# Patient Record
Sex: Female | Born: 1995
Health system: Southern US, Community
[De-identification: ages and names within clinical notes are randomized; demographics above are authoritative.]

## PROBLEM LIST (undated history)

## (undated) DIAGNOSIS — I10 Essential (primary) hypertension: Secondary | ICD-10-CM

## (undated) DIAGNOSIS — N289 Disorder of kidney and ureter, unspecified: Secondary | ICD-10-CM

## (undated) DIAGNOSIS — IMO0002 Reserved for concepts with insufficient information to code with codable children: Secondary | ICD-10-CM

## (undated) DIAGNOSIS — M329 Systemic lupus erythematosus, unspecified: Secondary | ICD-10-CM

## (undated) HISTORY — PX: RENAL BIOPSY: SHX156

---
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
## 2017-02-11 DIAGNOSIS — Z79899 Other long term (current) drug therapy: Secondary | ICD-10-CM | POA: Diagnosis not present

## 2017-02-11 DIAGNOSIS — R899 Unspecified abnormal finding in specimens from other organs, systems and tissues: Secondary | ICD-10-CM | POA: Diagnosis not present

## 2017-02-11 DIAGNOSIS — M328 Other forms of systemic lupus erythematosus: Secondary | ICD-10-CM | POA: Diagnosis not present

## 2017-02-25 DIAGNOSIS — Z79899 Other long term (current) drug therapy: Secondary | ICD-10-CM | POA: Diagnosis not present

## 2017-02-25 DIAGNOSIS — M3214 Glomerular disease in systemic lupus erythematosus: Secondary | ICD-10-CM | POA: Diagnosis not present

## 2017-02-25 DIAGNOSIS — M328 Other forms of systemic lupus erythematosus: Secondary | ICD-10-CM | POA: Diagnosis not present

## 2017-03-18 DIAGNOSIS — R0602 Shortness of breath: Secondary | ICD-10-CM | POA: Diagnosis not present

## 2017-03-18 DIAGNOSIS — M328 Other forms of systemic lupus erythematosus: Secondary | ICD-10-CM | POA: Diagnosis not present

## 2017-03-23 DIAGNOSIS — H538 Other visual disturbances: Secondary | ICD-10-CM | POA: Diagnosis not present

## 2017-03-23 DIAGNOSIS — L930 Discoid lupus erythematosus: Secondary | ICD-10-CM | POA: Diagnosis not present

## 2017-04-15 DIAGNOSIS — M328 Other forms of systemic lupus erythematosus: Secondary | ICD-10-CM | POA: Diagnosis not present

## 2017-04-15 DIAGNOSIS — R0602 Shortness of breath: Secondary | ICD-10-CM | POA: Diagnosis not present

## 2017-04-29 DIAGNOSIS — M3214 Glomerular disease in systemic lupus erythematosus: Secondary | ICD-10-CM | POA: Diagnosis not present

## 2017-04-29 DIAGNOSIS — Z79899 Other long term (current) drug therapy: Secondary | ICD-10-CM | POA: Diagnosis not present

## 2017-05-13 DIAGNOSIS — M328 Other forms of systemic lupus erythematosus: Secondary | ICD-10-CM | POA: Diagnosis not present

## 2017-05-13 DIAGNOSIS — R0602 Shortness of breath: Secondary | ICD-10-CM | POA: Diagnosis not present

## 2017-05-14 IMAGING — DX DG CHEST 2V
2 series · 2 of 2 positions shown · non-contrast
Comparison: None.

CLINICAL DATA: Generalized chest pain for 3 weeks. Chills for 3
days. History of lupus.

EXAM:
CHEST  2 VIEW

[chest lat]
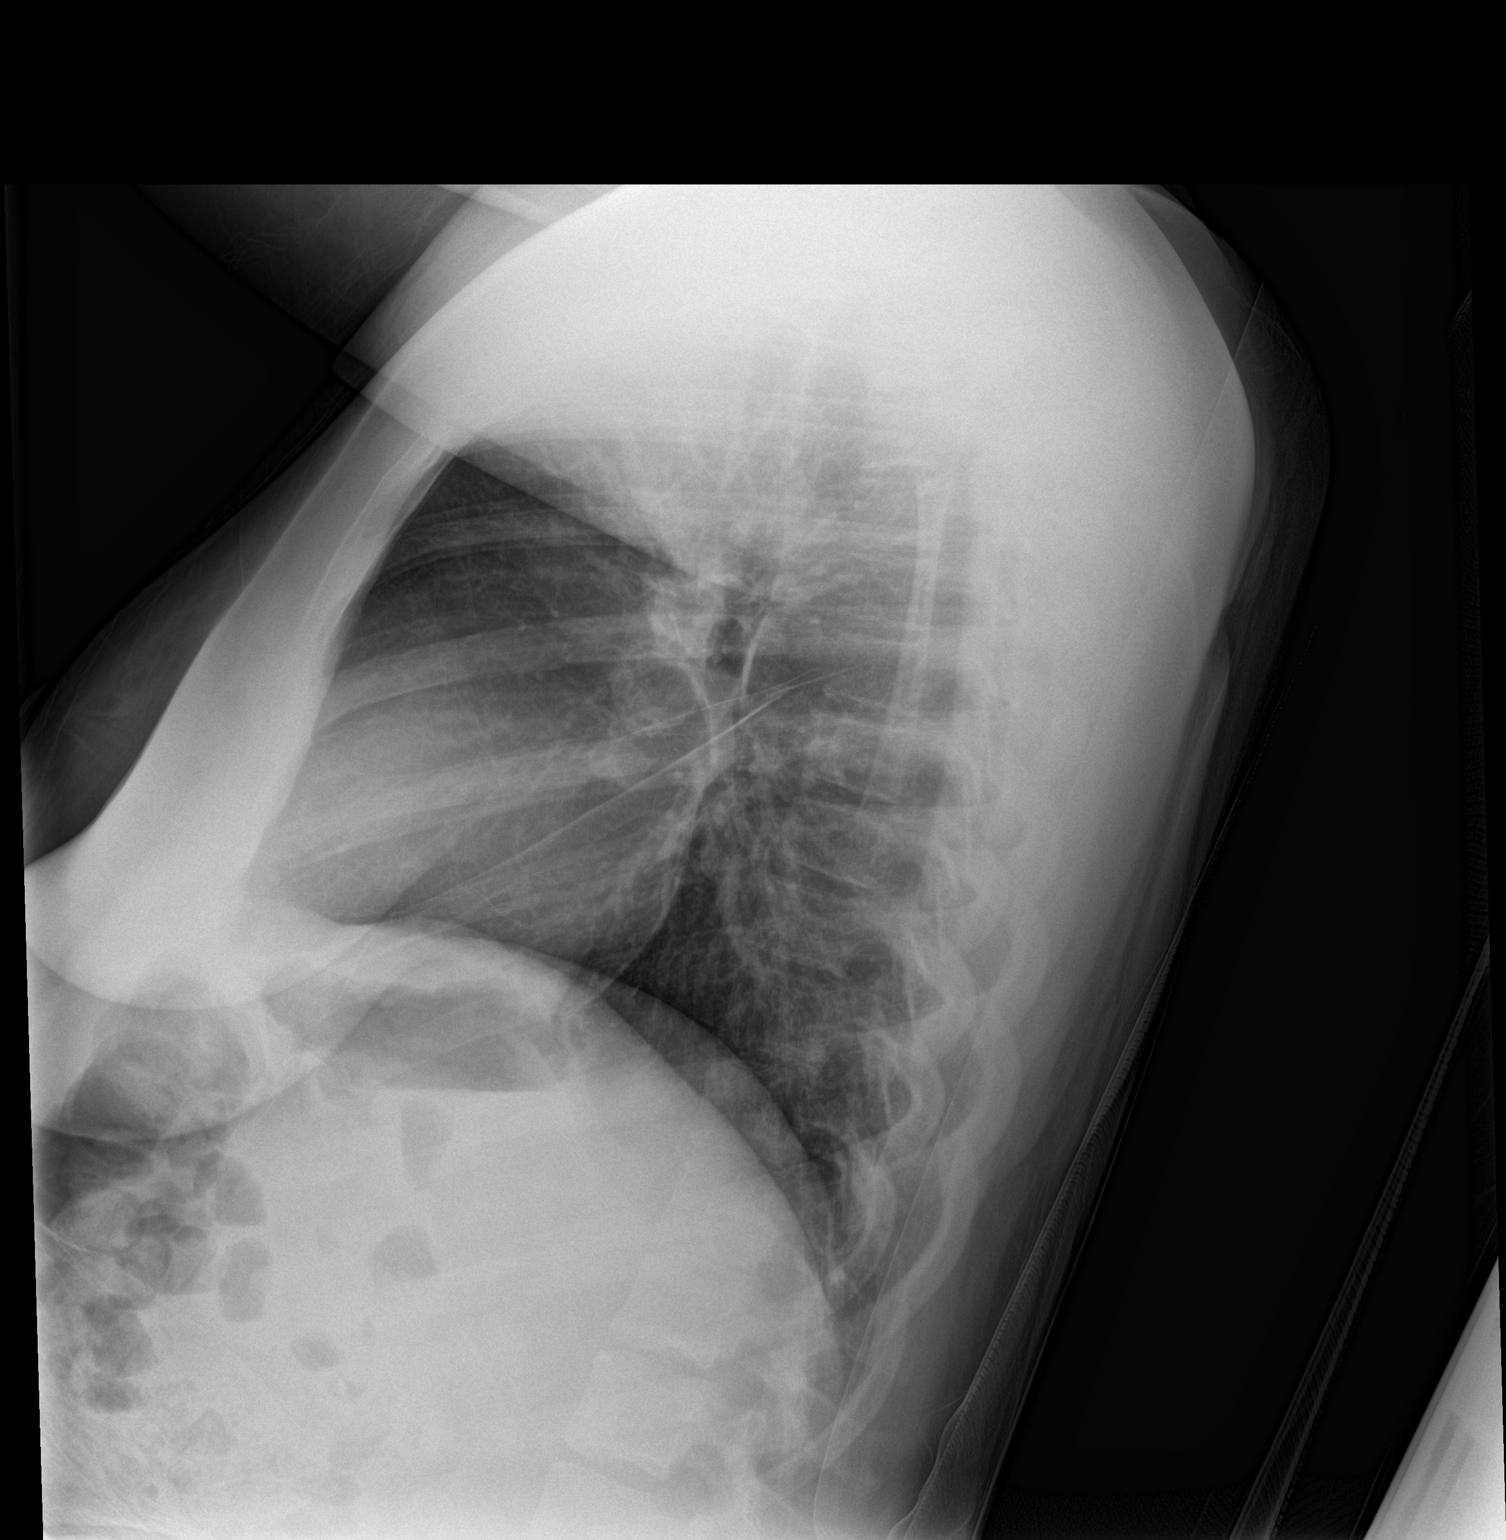

[chest ap]
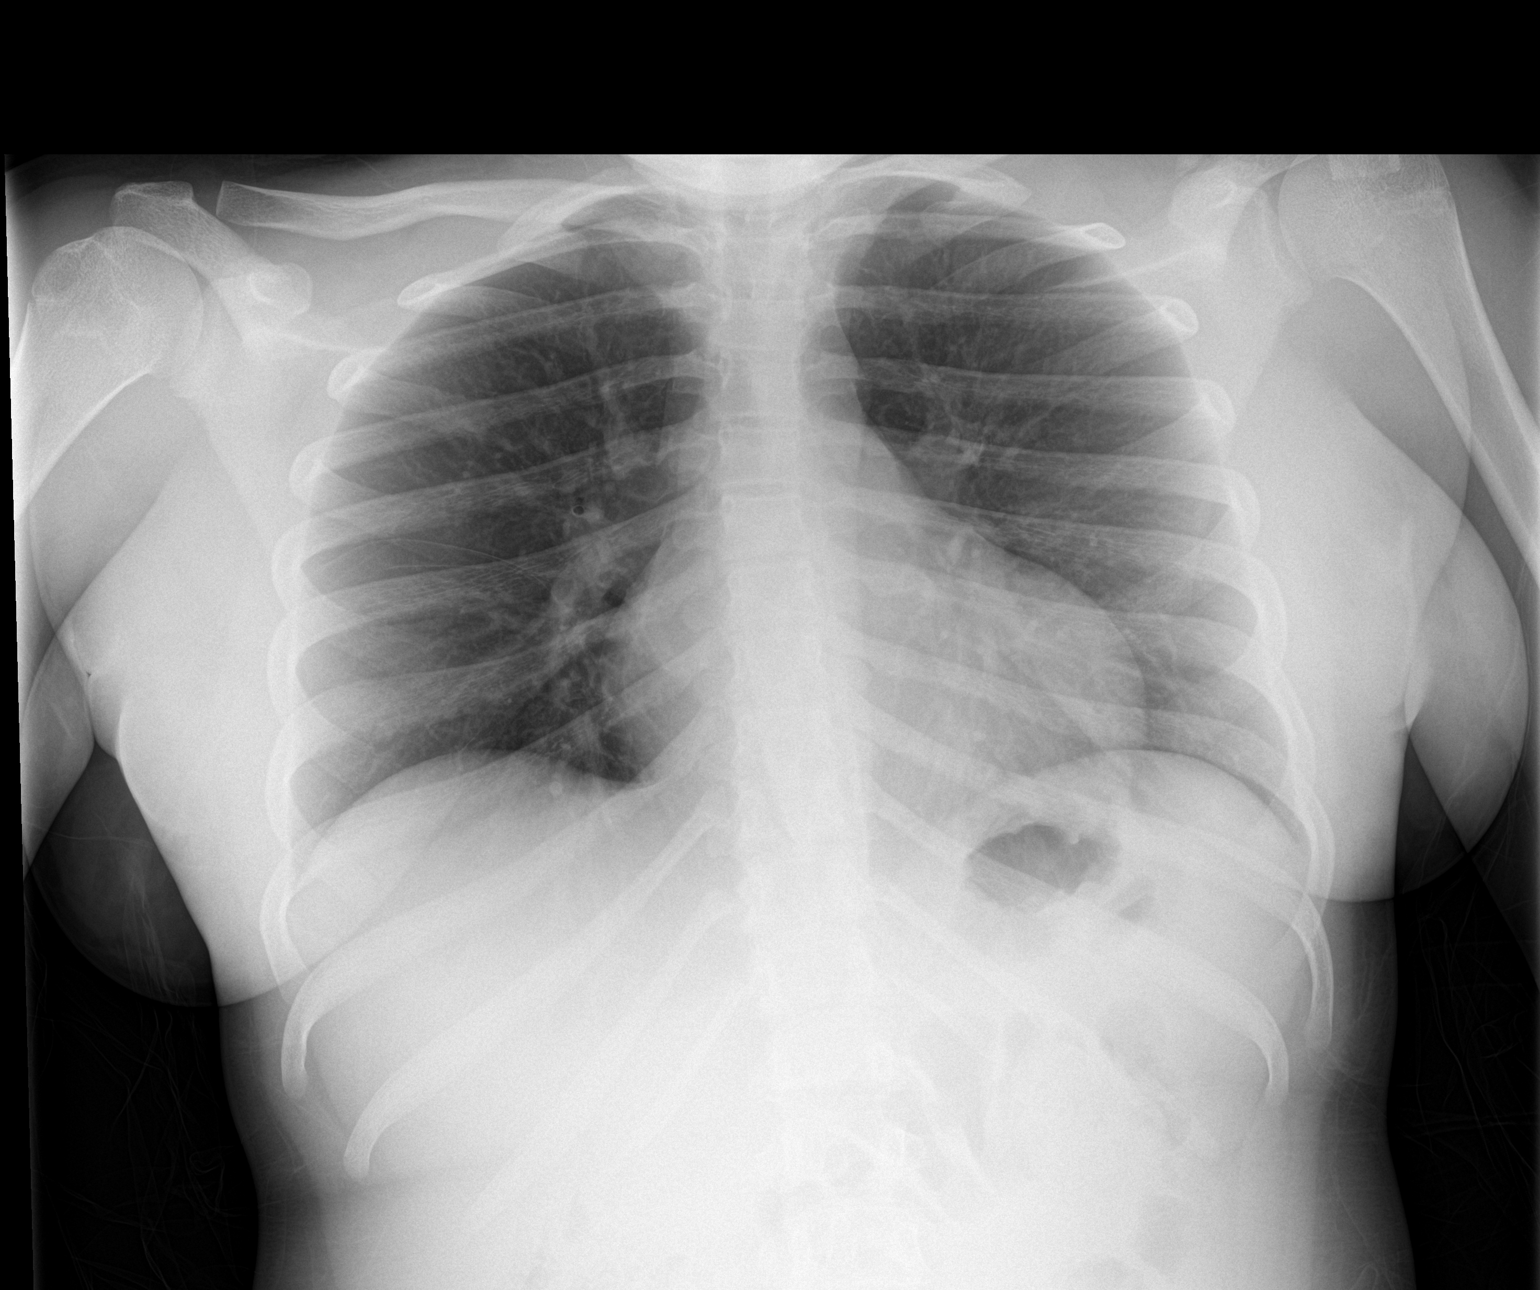

[2 of 2 positions shown; findings below may reference images not displayed]

FINDINGS: Apical lordotic projection. Given this projection and the AP
technique, heart size is within normal limits.

The lungs appear clear.  No pleural effusion.
IMPRESSION: 1.  No significant abnormality identified.

## 2017-06-10 DIAGNOSIS — Z79899 Other long term (current) drug therapy: Secondary | ICD-10-CM | POA: Diagnosis not present

## 2017-06-10 DIAGNOSIS — M3214 Glomerular disease in systemic lupus erythematosus: Secondary | ICD-10-CM | POA: Diagnosis not present

## 2017-06-16 DIAGNOSIS — Z30018 Encounter for initial prescription of other contraceptives: Secondary | ICD-10-CM | POA: Diagnosis not present

## 2017-06-16 DIAGNOSIS — N760 Acute vaginitis: Secondary | ICD-10-CM | POA: Diagnosis not present

## 2017-06-16 DIAGNOSIS — Z113 Encounter for screening for infections with a predominantly sexual mode of transmission: Secondary | ICD-10-CM | POA: Diagnosis not present

## 2017-06-16 DIAGNOSIS — N898 Other specified noninflammatory disorders of vagina: Secondary | ICD-10-CM | POA: Diagnosis not present

## 2017-06-17 DIAGNOSIS — M328 Other forms of systemic lupus erythematosus: Secondary | ICD-10-CM | POA: Diagnosis not present

## 2017-06-17 DIAGNOSIS — R0602 Shortness of breath: Secondary | ICD-10-CM | POA: Diagnosis not present

## 2017-06-17 DIAGNOSIS — Z79899 Other long term (current) drug therapy: Secondary | ICD-10-CM | POA: Diagnosis not present

## 2017-07-01 DIAGNOSIS — Z30013 Encounter for initial prescription of injectable contraceptive: Secondary | ICD-10-CM | POA: Diagnosis not present

## 2017-07-08 DIAGNOSIS — M328 Other forms of systemic lupus erythematosus: Secondary | ICD-10-CM | POA: Diagnosis not present

## 2017-07-08 DIAGNOSIS — R0602 Shortness of breath: Secondary | ICD-10-CM | POA: Diagnosis not present

## 2017-07-16 DIAGNOSIS — Z79899 Other long term (current) drug therapy: Secondary | ICD-10-CM | POA: Diagnosis not present

## 2017-07-16 DIAGNOSIS — M329 Systemic lupus erythematosus, unspecified: Secondary | ICD-10-CM | POA: Diagnosis not present

## 2017-08-05 DIAGNOSIS — R0602 Shortness of breath: Secondary | ICD-10-CM | POA: Diagnosis not present

## 2017-08-05 DIAGNOSIS — M328 Other forms of systemic lupus erythematosus: Secondary | ICD-10-CM | POA: Diagnosis not present

## 2017-09-08 DIAGNOSIS — R0602 Shortness of breath: Secondary | ICD-10-CM | POA: Diagnosis not present

## 2017-09-08 DIAGNOSIS — M328 Other forms of systemic lupus erythematosus: Secondary | ICD-10-CM | POA: Diagnosis not present

## 2017-09-11 DIAGNOSIS — M3214 Glomerular disease in systemic lupus erythematosus: Secondary | ICD-10-CM | POA: Diagnosis not present

## 2017-09-28 DIAGNOSIS — Z3042 Encounter for surveillance of injectable contraceptive: Secondary | ICD-10-CM | POA: Diagnosis not present

## 2017-09-28 DIAGNOSIS — Z3202 Encounter for pregnancy test, result negative: Secondary | ICD-10-CM | POA: Diagnosis not present

## 2017-09-30 DIAGNOSIS — R630 Anorexia: Secondary | ICD-10-CM | POA: Diagnosis not present

## 2017-09-30 DIAGNOSIS — E559 Vitamin D deficiency, unspecified: Secondary | ICD-10-CM | POA: Diagnosis not present

## 2017-09-30 DIAGNOSIS — Z79899 Other long term (current) drug therapy: Secondary | ICD-10-CM | POA: Diagnosis not present

## 2017-09-30 DIAGNOSIS — F419 Anxiety disorder, unspecified: Secondary | ICD-10-CM | POA: Diagnosis not present

## 2017-09-30 DIAGNOSIS — Z9114 Patient's other noncompliance with medication regimen: Secondary | ICD-10-CM | POA: Diagnosis not present

## 2017-09-30 DIAGNOSIS — R634 Abnormal weight loss: Secondary | ICD-10-CM | POA: Diagnosis not present

## 2017-09-30 DIAGNOSIS — R109 Unspecified abdominal pain: Secondary | ICD-10-CM | POA: Diagnosis not present

## 2017-09-30 DIAGNOSIS — M3214 Glomerular disease in systemic lupus erythematosus: Secondary | ICD-10-CM | POA: Diagnosis not present

## 2017-10-06 DIAGNOSIS — R000 Tachycardia, unspecified: Secondary | ICD-10-CM | POA: Diagnosis not present

## 2017-10-06 DIAGNOSIS — E876 Hypokalemia: Secondary | ICD-10-CM | POA: Diagnosis not present

## 2017-10-06 DIAGNOSIS — M328 Other forms of systemic lupus erythematosus: Secondary | ICD-10-CM | POA: Diagnosis not present

## 2017-10-06 DIAGNOSIS — J069 Acute upper respiratory infection, unspecified: Secondary | ICD-10-CM | POA: Diagnosis not present

## 2017-10-07 ENCOUNTER — Encounter (HOSPITAL_COMMUNITY): Payer: Self-pay

## 2017-10-07 ENCOUNTER — Emergency Department (HOSPITAL_COMMUNITY): Payer: BLUE CROSS/BLUE SHIELD

## 2017-10-07 ENCOUNTER — Other Ambulatory Visit: Payer: Self-pay

## 2017-10-07 DIAGNOSIS — Z79899 Other long term (current) drug therapy: Secondary | ICD-10-CM | POA: Diagnosis not present

## 2017-10-07 DIAGNOSIS — I10 Essential (primary) hypertension: Secondary | ICD-10-CM | POA: Insufficient documentation

## 2017-10-07 DIAGNOSIS — R509 Fever, unspecified: Secondary | ICD-10-CM | POA: Diagnosis not present

## 2017-10-07 DIAGNOSIS — R079 Chest pain, unspecified: Secondary | ICD-10-CM | POA: Diagnosis not present

## 2017-10-07 LAB — I-STAT TROPONIN, ED: Troponin i, poc: 0 ng/mL (ref 0.00–0.08)

## 2017-10-07 LAB — I-STAT BETA HCG BLOOD, ED (MC, WL, AP ONLY): I-stat hCG, quantitative: <5 m[IU]/mL (ref <5)

## 2017-10-07 LAB — CBC
HCT: 33.3 % — ABNORMAL LOW (ref 36.0–46.0)
Hemoglobin: 11.2 g/dL — ABNORMAL LOW (ref 12.0–15.0)
MCH: 28.9 pg (ref 26.0–34.0)
MCHC: 33.6 g/dL (ref 30.0–36.0)
MCV: 86 fL (ref 78.0–100.0)
Platelets: 183 10*3/uL (ref 150–400)
RBC: 3.87 MIL/uL (ref 3.87–5.11)
RDW: 13.3 % (ref 11.5–15.5)
WBC: 2.2 10*3/uL — ABNORMAL LOW (ref 4.0–10.5)

## 2017-10-07 LAB — BASIC METABOLIC PANEL
Anion gap: 10 (ref 5–15)
BUN: 11 mg/dL (ref 6–20)
CO2: 24 mmol/L (ref 22–32)
Calcium: 8.6 mg/dL — ABNORMAL LOW (ref 8.9–10.3)
Chloride: 105 mmol/L (ref 101–111)
Creatinine, Ser: 0.72 mg/dL (ref 0.44–1.00)
GFR calc Af Amer: >60 mL/min (ref >60)
GFR calc non Af Amer: >60 mL/min (ref >60)
Glucose, Bld: 74 mg/dL (ref 65–99)
Potassium: 3 mmol/L — ABNORMAL LOW (ref 3.5–5.1)
Sodium: 139 mmol/L (ref 135–145)

## 2017-10-07 NOTE — ED Triage Notes (Signed)
Pt states that she began to have today, central, denies cough. Some SOB, denies n/v

## 2017-10-07 NOTE — ED Triage Notes (Signed)
Pt has Lupus and has had a fever for two days. [facility]. Pt was seen by her doctor Wed and given two IV medications, seen yesterday and could not receive her medication due to fever.

## 2017-10-08 ENCOUNTER — Emergency Department (HOSPITAL_COMMUNITY)
Admission: EM | Admit: 2017-10-08 | Discharge: 2017-10-08 | Disposition: A | Discharge status: Home / Self Care | Payer: BLUE CROSS/BLUE SHIELD | Attending: Emergency Medicine | Admitting: Emergency Medicine

## 2017-10-08 DIAGNOSIS — R509 Fever, unspecified: Secondary | ICD-10-CM

## 2017-10-08 LAB — DIFFERENTIAL
Basophils Absolute: 0 10*3/uL (ref 0.0–0.1)
Basophils Relative: 0 %
Eosinophils Absolute: 0 10*3/uL (ref 0.0–0.7)
Eosinophils Relative: 0 %
Lymphocytes Relative: 25 %
Lymphs Abs: 0.6 10*3/uL — ABNORMAL LOW (ref 0.7–4.0)
Monocytes Absolute: 0.2 10*3/uL (ref 0.1–1.0)
Monocytes Relative: 10 %
Neutro Abs: 1.5 10*3/uL — ABNORMAL LOW (ref 1.7–7.7)
Neutrophils Relative %: 65 %

## 2017-10-08 LAB — URINALYSIS, ROUTINE W REFLEX MICROSCOPIC
Bacteria, UA: NONE SEEN
Bilirubin Urine: NEGATIVE
Glucose, UA: NEGATIVE mg/dL
Hgb urine dipstick: NEGATIVE
Ketones, ur: 5 mg/dL — AB
Leukocytes, UA: NEGATIVE
Nitrite: NEGATIVE
Protein, ur: 30 mg/dL — AB
Specific Gravity, Urine: 1.038 — ABNORMAL HIGH (ref 1.005–1.030)
pH: 5 (ref 5.0–8.0)

## 2017-10-08 LAB — INFLUENZA PANEL BY PCR (TYPE A & B)
Influenza A By PCR: NEGATIVE
Influenza B By PCR: NEGATIVE

## 2017-10-08 MED ORDER — DEXTROSE 5 % IV SOLN
1.0000 g | Freq: Once | INTRAVENOUS | Status: AC
  Administered 2017-10-08: 1 g via INTRAVENOUS
  Filled 2017-10-08: qty 10

## 2017-10-08 NOTE — ED Notes (Signed)
Per MD request, paged the [facility] to Dr. Blinda LeatherwoodPollina @ [phone] to [phone].

## 2017-10-08 NOTE — ED Notes (Signed)
Pt verbalizes understanding of d/c instructions. Pt ambulatory at d/c with all belongings.   

## 2017-10-08 NOTE — ED Notes (Signed)
[facility] #: [phone]  Pt's MD requested certain labs to be taken and to speak with ED MD.

## 2017-10-08 NOTE — ED Provider Notes (Signed)
[facility] EMERGENCY DEPARTMENT Provider Note   CSN: [id number] Arrival date & time: 10/07/17  1956     History   Chief Complaint Chief Complaint  Patient presents with  . Lupus  . Chest Pain    HPI Darlene Ruiz is a 21 y.o. female.  Patient presents to the emergency department for evaluation of chest pain.  Patient has a history of SLE.  She is currently being treated at [facility].  She is on Imuran, prednisone, Prograf and is getting IV infusions of pentamadine every 2 weeks.  She was seen at [facility] on December 4 for her infusion, but a that time she was found to have a fever of 102.1.  Pentamadine was held.  Blood work was performed.  Today she developed central chest pain.  She reports that she has been very tired and sleeping all day.  She did take Tylenol for her fever.  At time of evaluation in the ER exam room, her chest pain has resolved.  She is not experiencing any shortness of breath.  She has not had any cough or chest congestion.      Past Medical History:  Diagnosis Date  . Hypertension   . Lupus   . Renal disorder     There are no active problems to display for this patient.   Past Surgical History:  Procedure Laterality Date  . RENAL BIOPSY      OB History    No data available       Home Medications    Prior to Admission medications   Medication Sig Start Date End Date Taking? Authorizing Provider  benzonatate (TESSALON) 100 MG capsule Take 1 capsule (100 mg total) by mouth 3 (three) times daily as needed for cough. Patient not taking: Reported on 10/07/2017 06/16/15   Doreene Eland, MD  cefUROXime (CEFTIN) 500 MG tablet Take 1 tablet (500 mg total) by mouth 2 (two) times daily with a meal. Patient not taking: Reported on 10/07/2017 06/11/14   Rodolph Bong, MD  Cytomegalovirus Immune Glob (CYTOGAM IV) Inject into the vein every 3 (three) months.    [provider]  folic acid (FOLVITE) 1 MG tablet Take 1 mg by mouth  daily.    [provider]  Guaifenesin 1200 MG TB12 Take 1 tablet (1,200 mg total) by mouth 2 (two) times daily. 10/26/16   Lawyer, Cristal Deer, PA-C  hydroxychloroquine (PLAQUENIL) 200 MG tablet Take 300 mg by mouth daily.    [provider]  ibuprofen (ADVIL,MOTRIN) 800 MG tablet Take 1 tablet (800 mg total) by mouth every 8 (eight) hours as needed. 10/26/16   Lawyer, Cristal Deer, PA-C  lisinopril (PRINIVIL,ZESTRIL) 10 MG tablet Take 10 mg by mouth daily.    [provider]  loratadine (CLARITIN) 10 MG tablet Take 10 mg by mouth daily as needed. allergies    [provider]  montelukast (SINGULAIR) 10 MG tablet Take 10 mg by mouth daily as needed. allergies    [provider]  pimecrolimus (ELIDEL) 1 % cream Apply 1 application topically daily as needed. Skin rash    [provider]  predniSONE (DELTASONE) 20 MG tablet Take 1 tablet (20 mg total) by mouth daily. Patient not taking: Reported on 10/07/2017 05/13/15   Ree Shay, MD  promethazine-dextromethorphan (PROMETHAZINE-DM) 6.25-15 MG/5ML syrup Take 5 mLs by mouth 4 (four) times daily as needed for cough. 10/26/16   Lawyer, Cristal Deer, PA-C  sulfacetamide (BLEPH-10) 10 % ophthalmic ointment 2 drops  in both eyes  Every 3 hrs x 7 days 06/16/15   Janit PaganEniola, Kehinde T, MD  TACROLIMUS PO Take by mouth 2 (two) times daily.    [provider]  traMADol (ULTRAM) 50 MG tablet Take 1 tablet (50 mg total) by mouth every 6 (six) hours as needed. 02/06/16   Charlestine NightLawyer, Christopher, PA-C    Family History Family History  Problem Relation Age of Onset  . Hypertension Other     Social History Social History   Tobacco Use  . Smoking status: Never Smoker  . Smokeless tobacco: Never Used  Substance Use Topics  . Alcohol use: No  . Drug use: No     Allergies   Patient has no known allergies.   Review of Systems Review of Systems  Constitutional: Positive for fatigue and fever.  All other  systems reviewed and are negative.    Physical Exam Updated Vital Signs BP 104/71   Pulse 73   Temp 99.7 F (37.6 C) (Oral)   Resp (!) 23   SpO2 100%   Physical Exam  Constitutional: She is oriented to person, place, and time. She appears well-developed and well-nourished. No distress.  HENT:  Head: Normocephalic and atraumatic.  Right Ear: Hearing normal.  Left Ear: Hearing normal.  Nose: Nose normal.  Mouth/Throat: Oropharynx is clear and moist and mucous membranes are normal.  Eyes: Conjunctivae and EOM are normal. Pupils are equal, round, and reactive to light.  Neck: Normal range of motion. Neck supple.  Cardiovascular: Regular rhythm, S1 normal and S2 normal. Exam reveals no gallop and no friction rub.  No murmur heard. Pulmonary/Chest: Effort normal and breath sounds normal. No respiratory distress. She exhibits no tenderness.  Abdominal: Soft. Normal appearance and bowel sounds are normal. There is no hepatosplenomegaly. There is no tenderness. There is no rebound, no guarding, no tenderness at McBurney's point and negative Murphy's sign. No hernia.  Musculoskeletal: Normal range of motion.  Neurological: She is alert and oriented to person, place, and time. She has normal strength. No cranial nerve deficit or sensory deficit. Coordination normal. GCS eye subscore is 4. GCS verbal subscore is 5. GCS motor subscore is 6.  Skin: Skin is warm, dry and intact. No rash (malar) noted. No cyanosis.  Psychiatric: She has a normal mood and affect. Her speech is normal and behavior is normal. Thought content normal.  Nursing note and vitals reviewed.    ED Treatments / Results  Labs (all labs ordered are listed, but only abnormal results are displayed) Labs Reviewed  BASIC METABOLIC PANEL - Abnormal; Notable for the following components:      Result Value   Potassium 3.0 (*)    Calcium 8.6 (*)    All other components within normal limits  CBC - Abnormal; Notable for the  following components:   WBC 2.2 (*)    Hemoglobin 11.2 (*)    HCT 33.3 (*)    All other components within normal limits  URINALYSIS, ROUTINE W REFLEX MICROSCOPIC - Abnormal; Notable for the following components:   Color, Urine AMBER (*)    APPearance HAZY (*)    Specific Gravity, Urine 1.038 (*)    Ketones, ur 5 (*)    Protein, ur 30 (*)    Squamous Epithelial / LPF 0-5 (*)    All other components within normal limits  DIFFERENTIAL - Abnormal; Notable for the following components:   Neutro Abs 1.5 (*)    Lymphs Abs 0.6 (*)  All other components within normal limits  CULTURE, BLOOD (ROUTINE X 2)  CULTURE, BLOOD (ROUTINE X 2)  URINE CULTURE  INFLUENZA PANEL BY PCR (TYPE A & B)  I-STAT TROPONIN, ED  I-STAT BETA HCG BLOOD, ED ([facility], [facility], [facility] ONLY)    EKG  EKG Interpretation  Date/Time:  Wednesday October 07 2017 20:12:03 EST Ventricular Rate:  87 PR Interval:  162 QRS Duration: 70 QT Interval:  262 QTC Calculation: 315 R Axis:   103 Text Interpretation:  Normal sinus rhythm Rightward axis Septal infarct , age undetermined Abnormal ECG No previous tracing Confirmed by Gilda CreasePollina, Christopher J [phone]([id number]) on 10/08/2017 1:17:45 AM       Radiology Dg Chest 2 View  Result Date: 10/07/2017 CLINICAL DATA:  Central chest pain and fever x2 days EXAM: CHEST  2 VIEW COMPARISON:  10/26/2016 FINDINGS: The heart size and mediastinal contours are within normal limits. Both lungs are clear. The visualized skeletal structures are unremarkable. IMPRESSION: No active cardiopulmonary disease. Electronically Signed   By: Tollie Ethavid  Kwon M.D.   On: 10/07/2017 21:07    Procedures Procedures (including critical care time)  Medications Ordered in ED Medications  cefTRIAXone (ROCEPHIN) 1 g in dextrose 5 % 50 mL IVPB (not administered)     Initial Impression / Assessment and Plan / ED Course  I have reviewed the triage vital signs and the nursing notes.  Pertinent labs & imaging results that were  available during my care of the patient were reviewed by me and considered in my medical decision making (see chart for details).     Patient presents to the emergency department for evaluation of chest pain and fever.  Patient is currently on multiple immunosuppressants secondary to SLE.  [facility] pent[facility]adine 2 days ago, but was febrile and infusion was held.  Patient contacted her rheumatologist today when she started having chest pain and was referred to the ER for recheck.    Patient had resolution of her chest pain before she was seen.  She did not have any associated shortness of breath.  Pain was not pleuritic.  She has not tachycardic or tachypneic.  There is no hypoxia.  At this point there is nothing to indicate pulmonary embolism.  Repeat blood work was performed as requested.  She does not appear to be septic.  White blood cell count is low at 2.2, same as it was 2 days ago in the office.  She does have mild hypokalemia, once again.  Influenza was negative.  Urinalysis was negative.  Chest x-ray was negative.  This was discussed with on[facility]call [facility].  He asked for blood and urine cultures, empiric IV Rocephin and will follow up in the office.  This was discussed with patient and mother, they are comfortable with this plan.  Will contact rheumatology later today.  Final Clinical Impressions(s) / ED Diagnoses   Final diagnoses:  Fever, unspecified fever cause    ED Discharge Orders    None       Pollina, Canary Brimhristopher J, MD 10/08/17 [id number]

## 2017-10-08 NOTE — ED Notes (Signed)
ED Provider at bedside. 

## 2017-10-09 ENCOUNTER — Telehealth: Payer: Self-pay | Admitting: *Deleted

## 2017-10-09 LAB — URINE CULTURE: Culture: NO GROWTH

## 2017-10-09 NOTE — Telephone Encounter (Signed)
Pt mom called for results of urine culture as pt is still running fever.  EDCM read results in chart.

## 2017-10-13 LAB — CULTURE, BLOOD (ROUTINE X 2)
Culture: NO GROWTH
Culture: NO GROWTH
Special Requests: ADEQUATE
Special Requests: ADEQUATE

## 2017-10-14 DIAGNOSIS — M3214 Glomerular disease in systemic lupus erythematosus: Secondary | ICD-10-CM | POA: Diagnosis not present

## 2017-10-20 DIAGNOSIS — Z79899 Other long term (current) drug therapy: Secondary | ICD-10-CM | POA: Diagnosis not present

## 2017-10-20 DIAGNOSIS — R4189 Other symptoms and signs involving cognitive functions and awareness: Secondary | ICD-10-CM | POA: Diagnosis not present

## 2017-10-20 DIAGNOSIS — M3214 Glomerular disease in systemic lupus erythematosus: Secondary | ICD-10-CM | POA: Diagnosis not present

## 2017-10-20 DIAGNOSIS — R5383 Other fatigue: Secondary | ICD-10-CM | POA: Diagnosis not present

## 2017-10-23 DIAGNOSIS — I150 Renovascular hypertension: Secondary | ICD-10-CM | POA: Diagnosis not present

## 2017-10-23 DIAGNOSIS — Z79899 Other long term (current) drug therapy: Secondary | ICD-10-CM | POA: Diagnosis not present

## 2017-10-23 DIAGNOSIS — Z5181 Encounter for therapeutic drug level monitoring: Secondary | ICD-10-CM | POA: Diagnosis not present

## 2017-10-23 DIAGNOSIS — M3214 Glomerular disease in systemic lupus erythematosus: Secondary | ICD-10-CM | POA: Diagnosis not present

## 2017-11-18 DIAGNOSIS — M3214 Glomerular disease in systemic lupus erythematosus: Secondary | ICD-10-CM | POA: Diagnosis not present

## 2017-11-25 DIAGNOSIS — R030 Elevated blood-pressure reading, without diagnosis of hypertension: Secondary | ICD-10-CM | POA: Diagnosis not present

## 2017-11-25 DIAGNOSIS — Z79899 Other long term (current) drug therapy: Secondary | ICD-10-CM | POA: Diagnosis not present

## 2017-11-25 DIAGNOSIS — B373 Candidiasis of vulva and vagina: Secondary | ICD-10-CM | POA: Diagnosis not present

## 2017-11-25 DIAGNOSIS — M3214 Glomerular disease in systemic lupus erythematosus: Secondary | ICD-10-CM | POA: Diagnosis not present

## 2017-11-25 DIAGNOSIS — R109 Unspecified abdominal pain: Secondary | ICD-10-CM | POA: Diagnosis not present

## 2017-11-25 DIAGNOSIS — R634 Abnormal weight loss: Secondary | ICD-10-CM | POA: Diagnosis not present

## 2017-11-25 DIAGNOSIS — R630 Anorexia: Secondary | ICD-10-CM | POA: Diagnosis not present

## 2017-11-27 DIAGNOSIS — R0602 Shortness of breath: Secondary | ICD-10-CM | POA: Diagnosis not present

## 2017-11-27 DIAGNOSIS — M328 Other forms of systemic lupus erythematosus: Secondary | ICD-10-CM | POA: Diagnosis not present

## 2017-12-17 DIAGNOSIS — Z01419 Encounter for gynecological examination (general) (routine) without abnormal findings: Secondary | ICD-10-CM | POA: Diagnosis not present

## 2017-12-17 DIAGNOSIS — Z1389 Encounter for screening for other disorder: Secondary | ICD-10-CM | POA: Diagnosis not present

## 2017-12-17 DIAGNOSIS — Z3041 Encounter for surveillance of contraceptive pills: Secondary | ICD-10-CM | POA: Diagnosis not present

## 2017-12-17 DIAGNOSIS — Z6822 Body mass index (BMI) 22.0-22.9, adult: Secondary | ICD-10-CM | POA: Diagnosis not present

## 2017-12-17 DIAGNOSIS — Z130 Encounter for screening for diseases of the blood and blood-forming organs and certain disorders involving the immune mechanism: Secondary | ICD-10-CM | POA: Diagnosis not present

## 2017-12-17 DIAGNOSIS — Z113 Encounter for screening for infections with a predominantly sexual mode of transmission: Secondary | ICD-10-CM | POA: Diagnosis not present

## 2017-12-21 DIAGNOSIS — Z3042 Encounter for surveillance of injectable contraceptive: Secondary | ICD-10-CM | POA: Diagnosis not present

## 2017-12-29 DIAGNOSIS — R0602 Shortness of breath: Secondary | ICD-10-CM | POA: Diagnosis not present

## 2017-12-29 DIAGNOSIS — M328 Other forms of systemic lupus erythematosus: Secondary | ICD-10-CM | POA: Diagnosis not present

## 2018-02-05 DIAGNOSIS — E559 Vitamin D deficiency, unspecified: Secondary | ICD-10-CM | POA: Diagnosis not present

## 2018-02-05 DIAGNOSIS — Z9119 Patient's noncompliance with other medical treatment and regimen: Secondary | ICD-10-CM | POA: Diagnosis not present

## 2018-02-05 DIAGNOSIS — I150 Renovascular hypertension: Secondary | ICD-10-CM | POA: Diagnosis not present

## 2018-02-05 DIAGNOSIS — Z79899 Other long term (current) drug therapy: Secondary | ICD-10-CM | POA: Diagnosis not present

## 2018-02-05 DIAGNOSIS — M3214 Glomerular disease in systemic lupus erythematosus: Secondary | ICD-10-CM | POA: Diagnosis not present

## 2018-03-16 DIAGNOSIS — Z3042 Encounter for surveillance of injectable contraceptive: Secondary | ICD-10-CM | POA: Diagnosis not present

## 2018-03-31 DIAGNOSIS — Z114 Encounter for screening for human immunodeficiency virus [HIV]: Secondary | ICD-10-CM | POA: Diagnosis not present

## 2018-03-31 DIAGNOSIS — R634 Abnormal weight loss: Secondary | ICD-10-CM | POA: Diagnosis not present

## 2018-03-31 DIAGNOSIS — Z79899 Other long term (current) drug therapy: Secondary | ICD-10-CM | POA: Diagnosis not present

## 2018-03-31 DIAGNOSIS — M318 Other specified necrotizing vasculopathies: Secondary | ICD-10-CM | POA: Diagnosis not present

## 2018-03-31 DIAGNOSIS — M3214 Glomerular disease in systemic lupus erythematosus: Secondary | ICD-10-CM | POA: Diagnosis not present

## 2018-03-31 DIAGNOSIS — M3219 Other organ or system involvement in systemic lupus erythematosus: Secondary | ICD-10-CM | POA: Diagnosis not present

## 2018-04-13 DIAGNOSIS — R0981 Nasal congestion: Secondary | ICD-10-CM | POA: Diagnosis not present

## 2018-04-25 ENCOUNTER — Other Ambulatory Visit: Payer: Self-pay

## 2018-04-25 ENCOUNTER — Encounter (HOSPITAL_COMMUNITY): Payer: Self-pay | Admitting: Emergency Medicine

## 2018-04-25 ENCOUNTER — Ambulatory Visit (HOSPITAL_COMMUNITY)
Admission: EM | Admit: 2018-04-25 | Discharge: 2018-04-25 | Disposition: A | Discharge status: Home / Self Care | Payer: BLUE CROSS/BLUE SHIELD | Attending: Family Medicine | Admitting: Family Medicine

## 2018-04-25 DIAGNOSIS — R519 Headache, unspecified: Secondary | ICD-10-CM

## 2018-04-25 DIAGNOSIS — R51 Headache: Secondary | ICD-10-CM

## 2018-04-25 DIAGNOSIS — M791 Myalgia, unspecified site: Secondary | ICD-10-CM | POA: Diagnosis not present

## 2018-04-25 DIAGNOSIS — R220 Localized swelling, mass and lump, head: Secondary | ICD-10-CM | POA: Diagnosis not present

## 2018-04-25 DIAGNOSIS — Z8249 Family history of ischemic heart disease and other diseases of the circulatory system: Secondary | ICD-10-CM | POA: Insufficient documentation

## 2018-04-25 DIAGNOSIS — R110 Nausea: Secondary | ICD-10-CM | POA: Diagnosis not present

## 2018-04-25 DIAGNOSIS — R21 Rash and other nonspecific skin eruption: Secondary | ICD-10-CM

## 2018-04-25 DIAGNOSIS — R509 Fever, unspecified: Secondary | ICD-10-CM | POA: Diagnosis not present

## 2018-04-25 DIAGNOSIS — M329 Systemic lupus erythematosus, unspecified: Secondary | ICD-10-CM | POA: Diagnosis not present

## 2018-04-25 DIAGNOSIS — I10 Essential (primary) hypertension: Secondary | ICD-10-CM | POA: Insufficient documentation

## 2018-04-25 LAB — CBC WITH DIFFERENTIAL/PLATELET
Abs Immature Granulocytes: 0 10*3/uL (ref 0.0–0.1)
Basophils Absolute: 0 10*3/uL (ref 0.0–0.1)
Basophils Relative: 0 %
Eosinophils Absolute: 0 10*3/uL (ref 0.0–0.7)
Eosinophils Relative: 0 %
HCT: 38.6 % (ref 36.0–46.0)
Hemoglobin: 12.8 g/dL (ref 12.0–15.0)
Immature Granulocytes: 0 %
Lymphocytes Relative: 23 %
Lymphs Abs: 0.6 10*3/uL — ABNORMAL LOW (ref 0.7–4.0)
MCH: 29.2 pg (ref 26.0–34.0)
MCHC: 33.2 g/dL (ref 30.0–36.0)
MCV: 87.9 fL (ref 78.0–100.0)
Monocytes Absolute: 0.2 10*3/uL (ref 0.1–1.0)
Monocytes Relative: 6 %
Neutro Abs: 1.8 10*3/uL (ref 1.7–7.7)
Neutrophils Relative %: 71 %
Platelets: 206 10*3/uL (ref 150–400)
RBC: 4.39 MIL/uL (ref 3.87–5.11)
RDW: 13.3 % (ref 11.5–15.5)
WBC: 2.5 10*3/uL — ABNORMAL LOW (ref 4.0–10.5)

## 2018-04-25 LAB — POCT I-STAT, CHEM 8
BUN: 11 mg/dL (ref 6–20)
Calcium, Ion: 1.19 mmol/L (ref 1.15–1.40)
Chloride: 102 mmol/L (ref 101–111)
Creatinine, Ser: 0.7 mg/dL (ref 0.44–1.00)
Glucose, Bld: 85 mg/dL (ref 65–99)
HCT: 38 % (ref 36.0–46.0)
Hemoglobin: 12.9 g/dL (ref 12.0–15.0)
Potassium: 3.5 mmol/L (ref 3.5–5.1)
Sodium: 139 mmol/L (ref 135–145)
TCO2: 24 mmol/L (ref 22–32)

## 2018-04-25 LAB — POCT URINALYSIS DIP (DEVICE)
Bilirubin Urine: NEGATIVE
Glucose, UA: NEGATIVE mg/dL
Ketones, ur: NEGATIVE mg/dL
Leukocytes, UA: NEGATIVE
Nitrite: NEGATIVE
Protein, ur: 100 mg/dL — AB
Specific Gravity, Urine: >=1.03 (ref 1.005–1.030)
Urobilinogen, UA: 0.2 mg/dL (ref 0.0–1.0)
pH: 7 (ref 5.0–8.0)

## 2018-04-25 LAB — SEDIMENTATION RATE: Sed Rate: 35 mm/hr — ABNORMAL HIGH (ref 0–22)

## 2018-04-25 LAB — C-REACTIVE PROTEIN: CRP: 0.9 mg/dL (ref <1.0)

## 2018-04-25 MED ORDER — ACETAMINOPHEN 325 MG PO TABS
650.0000 mg | ORAL_TABLET | Freq: Once | ORAL | Status: AC
  Administered 2018-04-25: 650 mg via ORAL

## 2018-04-25 MED ORDER — ACETAMINOPHEN 325 MG PO TABS
ORAL_TABLET | ORAL | Status: AC
  Filled 2018-04-25: qty 2

## 2018-04-25 MED ORDER — AMOXICILLIN 875 MG PO TABS: 875.0000 mg | ORAL_TABLET | Freq: Two times a day (BID) | ORAL | 0 refills | Status: DC

## 2018-04-25 NOTE — ED Provider Notes (Signed)
[facility]   [id number] 04/25/18 Arrival Time: 1524  No is is, crazy of the right over the SUBJECTIVE:  Darlene Ruiz is a 22 y.o. female who presents to the urgent care with complaint of facial swelling and general body aches x 2 days. Patient reports a hx of lupus and similar symptoms during previous crisis.   She has some lobe back pain along the belt line but no localized tenderness and no spinal tenderness.  She has no acute weakness in the legs although they do feel heavy.  She has not fallen.  Patient has had sinus congestion for 2 weeks.  Yes I know I ordered 5  There has been some nausea but no abdominal pain or vomiting.  There is been some headache on the right side along with a new rash.  Past Medical History:  Diagnosis Date  . Hypertension   . Lupus (HCC)   . Renal disorder    Family History  Problem Relation Age of Onset  . Hypertension Other    Social History   Socioeconomic History  . Marital status: Single    Spouse name: Not on file  . Number of children: Not on file  . Years of education: Not on file  . Highest education level: Not on file  Occupational History  . Not on file  Social Needs  . Financial resource strain: Not on file  . Food insecurity:    Worry: Not on file    Inability: Not on file  . Transportation needs:    Medical: Not on file    Non-medical: Not on file  Tobacco Use  . Smoking status: Never Smoker  . Smokeless tobacco: Never Used  Substance and Sexual Activity  . Alcohol use: No  . Drug use: No  . Sexual activity: Not Currently  Lifestyle  . Physical activity:    Days per week: Not on file    Minutes per session: Not on file  . Stress: Not on file  Relationships  . Social connections:    Talks on phone: Not on file    Gets together: Not on file    Attends religious service: Not on file    Active member of club or organization: Not on file    Attends meetings of clubs or organizations: Not on file   Relationship status: Not on file  . Intimate partner violence:    Fear of current or ex partner: Not on file    Emotionally abused: Not on file    Physically abused: Not on file    Forced sexual activity: Not on file  Other Topics Concern  . Not on file  Social History Narrative  . Not on file   No outpatient medications have been marked as taking for the 04/25/18 encounter [facility](Hospital Encounter).   No Known Allergies    ROS: As per HPI, remainder of ROS negative.   OBJECTIVE:   Vitals:   04/25/18 1538  BP: 112/78  Pulse: (!) 102  Resp: 18  Temp: (!) 100.7 F (38.2 C)  TempSrc: Oral  SpO2: 100%     General appearance: alert; no distress Eyes: PERRL; EOMI; conjunctiva normal HENT: normocephalic; atraumatic; TMs normal, canal normal, external ears normal without trauma; nasal mucosa normal; oral mucosa normal Neck: supple Lungs: clear to auscultation bilaterally Heart: regular rate and rhythm Abdomen: soft, non-tender; bowel sounds normal; no masses or organomegaly; no guarding or rebound tenderness Back: no CVA tenderness Extremities: no cyanosis or edema;  symmetrical with no gross deformities Skin: warm and dry     Neurologic: normal gait; grossly normal Psychological: alert and cooperative; normal mood and affect  I spent 16 minutes on the phone speaking with patient's rheumatologist, Dr. Keturah Shavers, to discuss possible etiologies of this fever and acute rash along with myalgia.  We decided to start with treating the patient for sinusitis given that her symptoms of been 2 weeks in duration.    Labs:  Results for orders placed or performed during the hospital encounter of 10/08/17  Culture, blood (Routine X 2) w Reflex to ID Panel  Result Value Ref Range   Specimen Description BLOOD RIGHT ANTECUBITAL    Special Requests      BOTTLES DRAWN AEROBIC AND ANAEROBIC Blood Culture adequate volume   Culture NO GROWTH 5 DAYS    Report Status 10/13/2017 FINAL     Culture, blood (Routine X 2) w Reflex to ID Panel  Result Value Ref Range   Specimen Description BLOOD LEFT ANTECUBITAL    Special Requests      BOTTLES DRAWN AEROBIC AND ANAEROBIC Blood Culture adequate volume   Culture NO GROWTH 5 DAYS    Report Status 10/13/2017 FINAL   Urine Culture  Result Value Ref Range   Specimen Description URINE, CLEAN CATCH    Special Requests NONE    Culture NO GROWTH    Report Status 10/09/2017 FINAL   Basic metabolic panel  Result Value Ref Range   Sodium 139 135 - 145 mmol/L   Potassium 3.0 (L) 3.5 - 5.1 mmol/L   Chloride 105 101 - 111 mmol/L   CO2 24 22 - 32 mmol/L   Glucose, Bld 74 65 - 99 mg/dL   BUN 11 6 - 20 mg/dL   Creatinine, Ser 1.61 0.44 - 1.00 mg/dL   Calcium 8.6 (L) 8.9 - 10.3 mg/dL   GFR calc non Af Amer >60 >60 mL/min   GFR calc Af Amer >60 >60 mL/min   Anion gap 10 5 - 15  CBC  Result Value Ref Range   WBC 2.2 (L) 4.0 - 10.5 K/uL   RBC 3.87 3.87 - 5.11 MIL/uL   Hemoglobin 11.2 (L) 12.0 - 15.0 g/dL   HCT 09.6 (L) 04.5 - 40.9 %   MCV 86.0 78.0 - 100.0 fL   MCH 28.9 26.0 - 34.0 pg   MCHC 33.6 30.0 - 36.0 g/dL   RDW 81.1 91.4 - 78.2 %   Platelets 183 150 - 400 K/uL  Influenza panel by PCR (type A & B)  Result Value Ref Range   Influenza A By PCR NEGATIVE NEGATIVE   Influenza B By PCR NEGATIVE NEGATIVE  Urinalysis, Routine w reflex microscopic  Result Value Ref Range   Color, Urine AMBER (A) YELLOW   APPearance HAZY (A) CLEAR   Specific Gravity, Urine 1.038 (H) 1.005 - 1.030   pH 5.0 5.0 - 8.0   Glucose, UA NEGATIVE NEGATIVE mg/dL   Hgb urine dipstick NEGATIVE NEGATIVE   Bilirubin Urine NEGATIVE NEGATIVE   Ketones, ur 5 (A) NEGATIVE mg/dL   Protein, ur 30 (A) NEGATIVE mg/dL   Nitrite NEGATIVE NEGATIVE   Leukocytes, UA NEGATIVE NEGATIVE   RBC / HPF 0-5 0 - 5 RBC/hpf   WBC, UA 0-5 0 - 5 WBC/hpf   Bacteria, UA NONE SEEN NONE SEEN   Squamous Epithelial / LPF 0-5 (A) NONE SEEN   Mucus PRESENT   Differential  Result  Value Ref Range   Neutrophils  Relative % 65 %   Neutro Abs 1.5 (L) 1.7 - 7.7 K/uL   Lymphocytes Relative 25 %   Lymphs Abs 0.6 (L) 0.7 - 4.0 K/uL   Monocytes Relative 10 %   Monocytes Absolute 0.2 0.1 - 1.0 K/uL   Eosinophils Relative 0 %   Eosinophils Absolute 0.0 0.0 - 0.7 K/uL   Basophils Relative 0 %   Basophils Absolute 0.0 0.0 - 0.1 K/uL  I-stat troponin, ED  Result Value Ref Range   Troponin i, poc 0.00 0.00 - 0.08 ng/mL   Comment 3          I-Stat beta hCG blood, ED  Result Value Ref Range   I-stat hCG, quantitative <5.0 <5 mIU/mL   Comment 3            Labs Reviewed  CULTURE, BLOOD (SINGLE)  URINE CULTURE  CBC WITH DIFFERENTIAL/PLATELET  SEDIMENTATION RATE  C-REACTIVE PROTEIN  PROTEIN / CREATININE RATIO, URINE    No results found.     ASSESSMENT & PLAN:  1. Facial pain   2. Rash and nonspecific skin eruption   3. Lupus (HCC)   4. Fever, unspecified     Meds ordered this encounter  Medications  . acetaminophen (TYLENOL) tablet 650 mg  . amoxicillin (AMOXIL) 875 MG tablet    Sig: Take 1 tablet (875 mg total) by mouth 2 (two) times daily.    Dispense:  20 tablet    Refill:  0    Reviewed expectations re: course of current medical issues. Questions answered. Outlined signs and symptoms indicating need for more acute intervention. Patient verbalized understanding. After Visit Summary given.      Elvina Sidle, MD 04/25/18 [phone]

## 2018-04-25 NOTE — ED Triage Notes (Addendum)
[facility] with a complaint of facial swelling and general body aches x 2 days. Patient reports a hx of lupus and similar symptoms during previous crisis.

## 2018-04-25 NOTE — Discharge Instructions (Addendum)
Tylenol/Ibuprofen for fever and soreness  Please return to emergency room if legs become acutely weaker.  If symptoms dramatically worsen, call Dr. Hetty BlendBuckley immediately and head to the emergency room

## 2018-04-26 DIAGNOSIS — R51 Headache: Secondary | ICD-10-CM | POA: Diagnosis not present

## 2018-04-26 DIAGNOSIS — R509 Fever, unspecified: Secondary | ICD-10-CM | POA: Diagnosis not present

## 2018-04-26 LAB — URINE CULTURE: Culture: NO GROWTH

## 2018-04-30 DIAGNOSIS — M3214 Glomerular disease in systemic lupus erythematosus: Secondary | ICD-10-CM | POA: Diagnosis not present

## 2018-04-30 DIAGNOSIS — Z79899 Other long term (current) drug therapy: Secondary | ICD-10-CM | POA: Diagnosis not present

## 2018-04-30 DIAGNOSIS — R21 Rash and other nonspecific skin eruption: Secondary | ICD-10-CM | POA: Diagnosis not present

## 2018-05-03 DIAGNOSIS — R609 Edema, unspecified: Secondary | ICD-10-CM | POA: Diagnosis not present

## 2018-05-03 DIAGNOSIS — R21 Rash and other nonspecific skin eruption: Secondary | ICD-10-CM | POA: Diagnosis not present

## 2018-05-07 DIAGNOSIS — Z79899 Other long term (current) drug therapy: Secondary | ICD-10-CM | POA: Diagnosis not present

## 2018-05-07 DIAGNOSIS — M3214 Glomerular disease in systemic lupus erythematosus: Secondary | ICD-10-CM | POA: Diagnosis not present

## 2018-05-07 DIAGNOSIS — R634 Abnormal weight loss: Secondary | ICD-10-CM | POA: Diagnosis not present

## 2018-05-12 DIAGNOSIS — M3214 Glomerular disease in systemic lupus erythematosus: Secondary | ICD-10-CM | POA: Diagnosis not present

## 2018-05-13 DIAGNOSIS — M3214 Glomerular disease in systemic lupus erythematosus: Secondary | ICD-10-CM | POA: Diagnosis not present

## 2018-05-14 DIAGNOSIS — N3001 Acute cystitis with hematuria: Secondary | ICD-10-CM | POA: Diagnosis not present

## 2018-05-14 DIAGNOSIS — M320 Drug-induced systemic lupus erythematosus: Secondary | ICD-10-CM | POA: Diagnosis not present

## 2018-05-14 DIAGNOSIS — M3214 Glomerular disease in systemic lupus erythematosus: Secondary | ICD-10-CM | POA: Diagnosis not present

## 2018-05-15 DIAGNOSIS — M3214 Glomerular disease in systemic lupus erythematosus: Secondary | ICD-10-CM | POA: Diagnosis not present

## 2018-06-04 DIAGNOSIS — R801 Persistent proteinuria, unspecified: Secondary | ICD-10-CM | POA: Diagnosis not present

## 2018-06-04 DIAGNOSIS — Z79899 Other long term (current) drug therapy: Secondary | ICD-10-CM | POA: Diagnosis not present

## 2018-06-04 DIAGNOSIS — M3214 Glomerular disease in systemic lupus erythematosus: Secondary | ICD-10-CM | POA: Diagnosis not present

## 2018-06-04 DIAGNOSIS — I150 Renovascular hypertension: Secondary | ICD-10-CM | POA: Diagnosis not present

## 2018-06-08 DIAGNOSIS — Z3042 Encounter for surveillance of injectable contraceptive: Secondary | ICD-10-CM | POA: Diagnosis not present

## 2018-07-28 DIAGNOSIS — R801 Persistent proteinuria, unspecified: Secondary | ICD-10-CM | POA: Diagnosis not present

## 2018-07-28 DIAGNOSIS — I150 Renovascular hypertension: Secondary | ICD-10-CM | POA: Diagnosis not present

## 2018-07-28 DIAGNOSIS — M3214 Glomerular disease in systemic lupus erythematosus: Secondary | ICD-10-CM | POA: Diagnosis not present

## 2018-07-28 DIAGNOSIS — Z79899 Other long term (current) drug therapy: Secondary | ICD-10-CM | POA: Diagnosis not present

## 2018-08-13 DIAGNOSIS — M3214 Glomerular disease in systemic lupus erythematosus: Secondary | ICD-10-CM | POA: Diagnosis not present

## 2018-08-13 DIAGNOSIS — Z79899 Other long term (current) drug therapy: Secondary | ICD-10-CM | POA: Diagnosis not present

## 2018-08-30 DIAGNOSIS — Z3042 Encounter for surveillance of injectable contraceptive: Secondary | ICD-10-CM | POA: Diagnosis not present

## 2018-09-13 DIAGNOSIS — R6889 Other general symptoms and signs: Secondary | ICD-10-CM | POA: Diagnosis not present

## 2018-09-13 DIAGNOSIS — N765 Ulceration of vagina: Secondary | ICD-10-CM | POA: Diagnosis not present

## 2018-09-13 DIAGNOSIS — Z202 Contact with and (suspected) exposure to infections with a predominantly sexual mode of transmission: Secondary | ICD-10-CM | POA: Diagnosis not present

## 2018-09-21 DIAGNOSIS — R6889 Other general symptoms and signs: Secondary | ICD-10-CM | POA: Diagnosis not present

## 2018-09-21 DIAGNOSIS — K921 Melena: Secondary | ICD-10-CM | POA: Diagnosis not present

## 2018-09-21 DIAGNOSIS — J988 Other specified respiratory disorders: Secondary | ICD-10-CM | POA: Diagnosis not present

## 2018-11-22 DIAGNOSIS — Z309 Encounter for contraceptive management, unspecified: Secondary | ICD-10-CM | POA: Diagnosis not present

## 2018-11-24 DIAGNOSIS — B009 Herpesviral infection, unspecified: Secondary | ICD-10-CM | POA: Diagnosis not present

## 2018-11-24 DIAGNOSIS — Z23 Encounter for immunization: Secondary | ICD-10-CM | POA: Diagnosis not present

## 2018-11-24 DIAGNOSIS — Z5181 Encounter for therapeutic drug level monitoring: Secondary | ICD-10-CM | POA: Diagnosis not present

## 2018-11-24 DIAGNOSIS — M3214 Glomerular disease in systemic lupus erythematosus: Secondary | ICD-10-CM | POA: Diagnosis not present

## 2018-11-24 DIAGNOSIS — I10 Essential (primary) hypertension: Secondary | ICD-10-CM | POA: Diagnosis not present

## 2018-11-24 DIAGNOSIS — Z9114 Patient's other noncompliance with medication regimen: Secondary | ICD-10-CM | POA: Diagnosis not present

## 2018-11-24 DIAGNOSIS — Z79899 Other long term (current) drug therapy: Secondary | ICD-10-CM | POA: Diagnosis not present

## 2019-01-14 DIAGNOSIS — B009 Herpesviral infection, unspecified: Secondary | ICD-10-CM | POA: Diagnosis not present

## 2019-01-14 DIAGNOSIS — Z79899 Other long term (current) drug therapy: Secondary | ICD-10-CM | POA: Diagnosis not present

## 2019-01-14 DIAGNOSIS — D72819 Decreased white blood cell count, unspecified: Secondary | ICD-10-CM | POA: Diagnosis not present

## 2019-01-14 DIAGNOSIS — M3214 Glomerular disease in systemic lupus erythematosus: Secondary | ICD-10-CM | POA: Diagnosis not present

## 2019-01-19 ENCOUNTER — Encounter: Payer: Self-pay | Admitting: Family Medicine

## 2019-01-20 DIAGNOSIS — Z1151 Encounter for screening for human papillomavirus (HPV): Secondary | ICD-10-CM | POA: Diagnosis not present

## 2019-01-20 DIAGNOSIS — R87610 Atypical squamous cells of undetermined significance on cytologic smear of cervix (ASC-US): Secondary | ICD-10-CM | POA: Diagnosis not present

## 2019-01-20 DIAGNOSIS — Z124 Encounter for screening for malignant neoplasm of cervix: Secondary | ICD-10-CM | POA: Diagnosis not present

## 2019-01-20 DIAGNOSIS — Z01419 Encounter for gynecological examination (general) (routine) without abnormal findings: Secondary | ICD-10-CM | POA: Diagnosis not present

## 2019-01-20 DIAGNOSIS — Z6821 Body mass index (BMI) 21.0-21.9, adult: Secondary | ICD-10-CM | POA: Diagnosis not present

## 2019-01-20 DIAGNOSIS — Z113 Encounter for screening for infections with a predominantly sexual mode of transmission: Secondary | ICD-10-CM | POA: Diagnosis not present

## 2019-01-20 DIAGNOSIS — Z130 Encounter for screening for diseases of the blood and blood-forming organs and certain disorders involving the immune mechanism: Secondary | ICD-10-CM | POA: Diagnosis not present

## 2019-01-20 DIAGNOSIS — Z3042 Encounter for surveillance of injectable contraceptive: Secondary | ICD-10-CM | POA: Diagnosis not present

## 2019-01-26 DIAGNOSIS — L309 Dermatitis, unspecified: Secondary | ICD-10-CM | POA: Diagnosis not present

## 2019-01-26 DIAGNOSIS — R6889 Other general symptoms and signs: Secondary | ICD-10-CM | POA: Diagnosis not present

## 2019-01-28 DIAGNOSIS — L309 Dermatitis, unspecified: Secondary | ICD-10-CM | POA: Diagnosis not present

## 2019-01-28 DIAGNOSIS — M329 Systemic lupus erythematosus, unspecified: Secondary | ICD-10-CM | POA: Diagnosis not present

## 2019-01-28 DIAGNOSIS — M3214 Glomerular disease in systemic lupus erythematosus: Secondary | ICD-10-CM | POA: Diagnosis not present

## 2019-02-07 DIAGNOSIS — M3214 Glomerular disease in systemic lupus erythematosus: Secondary | ICD-10-CM | POA: Diagnosis not present

## 2019-02-07 DIAGNOSIS — M329 Systemic lupus erythematosus, unspecified: Secondary | ICD-10-CM | POA: Diagnosis not present

## 2019-02-23 DIAGNOSIS — J302 Other seasonal allergic rhinitis: Secondary | ICD-10-CM | POA: Diagnosis not present

## 2019-02-23 DIAGNOSIS — M3214 Glomerular disease in systemic lupus erythematosus: Secondary | ICD-10-CM | POA: Diagnosis not present

## 2019-02-25 DIAGNOSIS — M3214 Glomerular disease in systemic lupus erythematosus: Secondary | ICD-10-CM | POA: Diagnosis not present

## 2019-03-04 DIAGNOSIS — Z79899 Other long term (current) drug therapy: Secondary | ICD-10-CM | POA: Diagnosis not present

## 2019-03-04 DIAGNOSIS — D72810 Lymphocytopenia: Secondary | ICD-10-CM | POA: Diagnosis not present

## 2019-03-04 DIAGNOSIS — L309 Dermatitis, unspecified: Secondary | ICD-10-CM | POA: Diagnosis not present

## 2019-03-04 DIAGNOSIS — M3214 Glomerular disease in systemic lupus erythematosus: Secondary | ICD-10-CM | POA: Diagnosis not present

## 2019-03-17 DIAGNOSIS — L309 Dermatitis, unspecified: Secondary | ICD-10-CM | POA: Diagnosis not present

## 2019-03-17 DIAGNOSIS — L308 Other specified dermatitis: Secondary | ICD-10-CM | POA: Diagnosis not present

## 2019-03-17 DIAGNOSIS — M3214 Glomerular disease in systemic lupus erythematosus: Secondary | ICD-10-CM | POA: Diagnosis not present

## 2019-03-21 DIAGNOSIS — Z3042 Encounter for surveillance of injectable contraceptive: Secondary | ICD-10-CM | POA: Diagnosis not present

## 2019-04-04 DIAGNOSIS — Z30013 Encounter for initial prescription of injectable contraceptive: Secondary | ICD-10-CM | POA: Diagnosis not present

## 2019-04-06 ENCOUNTER — Encounter: Payer: Self-pay | Admitting: Internal Medicine

## 2019-04-14 DIAGNOSIS — M3214 Glomerular disease in systemic lupus erythematosus: Secondary | ICD-10-CM | POA: Diagnosis not present

## 2019-04-14 DIAGNOSIS — L930 Discoid lupus erythematosus: Secondary | ICD-10-CM | POA: Diagnosis not present

## 2019-04-14 DIAGNOSIS — Z79899 Other long term (current) drug therapy: Secondary | ICD-10-CM | POA: Diagnosis not present

## 2019-04-14 DIAGNOSIS — L309 Dermatitis, unspecified: Secondary | ICD-10-CM | POA: Diagnosis not present

## 2019-04-25 DIAGNOSIS — M329 Systemic lupus erythematosus, unspecified: Secondary | ICD-10-CM | POA: Diagnosis not present

## 2019-04-25 DIAGNOSIS — Z79899 Other long term (current) drug therapy: Secondary | ICD-10-CM | POA: Diagnosis not present

## 2019-04-27 DIAGNOSIS — L309 Dermatitis, unspecified: Secondary | ICD-10-CM | POA: Diagnosis not present

## 2019-04-27 DIAGNOSIS — M3214 Glomerular disease in systemic lupus erythematosus: Secondary | ICD-10-CM | POA: Diagnosis not present

## 2019-04-27 DIAGNOSIS — Z79899 Other long term (current) drug therapy: Secondary | ICD-10-CM | POA: Diagnosis not present

## 2019-04-27 DIAGNOSIS — I150 Renovascular hypertension: Secondary | ICD-10-CM | POA: Diagnosis not present

## 2019-04-29 DIAGNOSIS — A6004 Herpesviral vulvovaginitis: Secondary | ICD-10-CM | POA: Diagnosis not present

## 2019-05-14 DIAGNOSIS — J029 Acute pharyngitis, unspecified: Secondary | ICD-10-CM | POA: Diagnosis not present

## 2019-05-14 DIAGNOSIS — M329 Systemic lupus erythematosus, unspecified: Secondary | ICD-10-CM | POA: Diagnosis not present

## 2019-05-14 DIAGNOSIS — R52 Pain, unspecified: Secondary | ICD-10-CM | POA: Diagnosis not present

## 2019-05-14 DIAGNOSIS — R509 Fever, unspecified: Secondary | ICD-10-CM | POA: Diagnosis not present

## 2019-05-14 DIAGNOSIS — R51 Headache: Secondary | ICD-10-CM | POA: Diagnosis not present

## 2019-05-14 DIAGNOSIS — Z79899 Other long term (current) drug therapy: Secondary | ICD-10-CM | POA: Diagnosis not present

## 2019-05-14 DIAGNOSIS — Z5181 Encounter for therapeutic drug level monitoring: Secondary | ICD-10-CM | POA: Diagnosis not present

## 2019-05-14 DIAGNOSIS — Z7952 Long term (current) use of systemic steroids: Secondary | ICD-10-CM | POA: Diagnosis not present

## 2019-05-14 DIAGNOSIS — R7982 Elevated C-reactive protein (CRP): Secondary | ICD-10-CM | POA: Diagnosis not present

## 2019-05-14 DIAGNOSIS — M3214 Glomerular disease in systemic lupus erythematosus: Secondary | ICD-10-CM | POA: Diagnosis not present

## 2019-05-14 DIAGNOSIS — R531 Weakness: Secondary | ICD-10-CM | POA: Diagnosis not present

## 2019-05-14 DIAGNOSIS — Z20828 Contact with and (suspected) exposure to other viral communicable diseases: Secondary | ICD-10-CM | POA: Diagnosis not present

## 2019-06-03 DIAGNOSIS — M3214 Glomerular disease in systemic lupus erythematosus: Secondary | ICD-10-CM | POA: Diagnosis not present

## 2019-06-03 DIAGNOSIS — Z79899 Other long term (current) drug therapy: Secondary | ICD-10-CM | POA: Diagnosis not present

## 2019-06-03 DIAGNOSIS — R7989 Other specified abnormal findings of blood chemistry: Secondary | ICD-10-CM | POA: Diagnosis not present

## 2019-06-07 DIAGNOSIS — L309 Dermatitis, unspecified: Secondary | ICD-10-CM | POA: Diagnosis not present

## 2019-06-07 DIAGNOSIS — L930 Discoid lupus erythematosus: Secondary | ICD-10-CM | POA: Diagnosis not present

## 2019-06-07 DIAGNOSIS — T380X5A Adverse effect of glucocorticoids and synthetic analogues, initial encounter: Secondary | ICD-10-CM | POA: Diagnosis not present

## 2019-06-07 DIAGNOSIS — L708 Other acne: Secondary | ICD-10-CM | POA: Diagnosis not present

## 2019-06-17 DIAGNOSIS — Z79899 Other long term (current) drug therapy: Secondary | ICD-10-CM | POA: Diagnosis not present

## 2019-06-27 DIAGNOSIS — Z3042 Encounter for surveillance of injectable contraceptive: Secondary | ICD-10-CM | POA: Diagnosis not present

## 2019-07-08 DIAGNOSIS — M3214 Glomerular disease in systemic lupus erythematosus: Secondary | ICD-10-CM | POA: Diagnosis not present

## 2019-07-08 DIAGNOSIS — R197 Diarrhea, unspecified: Secondary | ICD-10-CM | POA: Diagnosis not present

## 2019-07-27 DIAGNOSIS — R634 Abnormal weight loss: Secondary | ICD-10-CM | POA: Diagnosis not present

## 2019-07-27 DIAGNOSIS — R197 Diarrhea, unspecified: Secondary | ICD-10-CM | POA: Diagnosis not present

## 2019-07-27 DIAGNOSIS — M3214 Glomerular disease in systemic lupus erythematosus: Secondary | ICD-10-CM | POA: Diagnosis not present

## 2019-09-08 DIAGNOSIS — L708 Other acne: Secondary | ICD-10-CM | POA: Diagnosis not present

## 2019-09-08 DIAGNOSIS — M3214 Glomerular disease in systemic lupus erythematosus: Secondary | ICD-10-CM | POA: Diagnosis not present

## 2019-09-08 DIAGNOSIS — L930 Discoid lupus erythematosus: Secondary | ICD-10-CM | POA: Diagnosis not present

## 2019-09-08 DIAGNOSIS — L810 Postinflammatory hyperpigmentation: Secondary | ICD-10-CM | POA: Diagnosis not present

## 2019-09-19 DIAGNOSIS — Z3009 Encounter for other general counseling and advice on contraception: Secondary | ICD-10-CM | POA: Diagnosis not present

## 2019-09-19 DIAGNOSIS — M329 Systemic lupus erythematosus, unspecified: Secondary | ICD-10-CM | POA: Diagnosis not present

## 2019-09-19 DIAGNOSIS — M2550 Pain in unspecified joint: Secondary | ICD-10-CM | POA: Diagnosis not present

## 2019-09-19 DIAGNOSIS — R197 Diarrhea, unspecified: Secondary | ICD-10-CM | POA: Diagnosis not present

## 2019-09-19 DIAGNOSIS — L309 Dermatitis, unspecified: Secondary | ICD-10-CM | POA: Diagnosis not present

## 2019-09-19 DIAGNOSIS — M3214 Glomerular disease in systemic lupus erythematosus: Secondary | ICD-10-CM | POA: Diagnosis not present

## 2019-09-19 DIAGNOSIS — R634 Abnormal weight loss: Secondary | ICD-10-CM | POA: Diagnosis not present

## 2019-10-09 IMAGING — DX DG CHEST 2V
2 series · 2 of 2 positions shown · non-contrast
Comparison: 10/26/2016

CLINICAL DATA: Central chest pain and fever x2 days

EXAM:
CHEST  2 VIEW

[w chest lat]
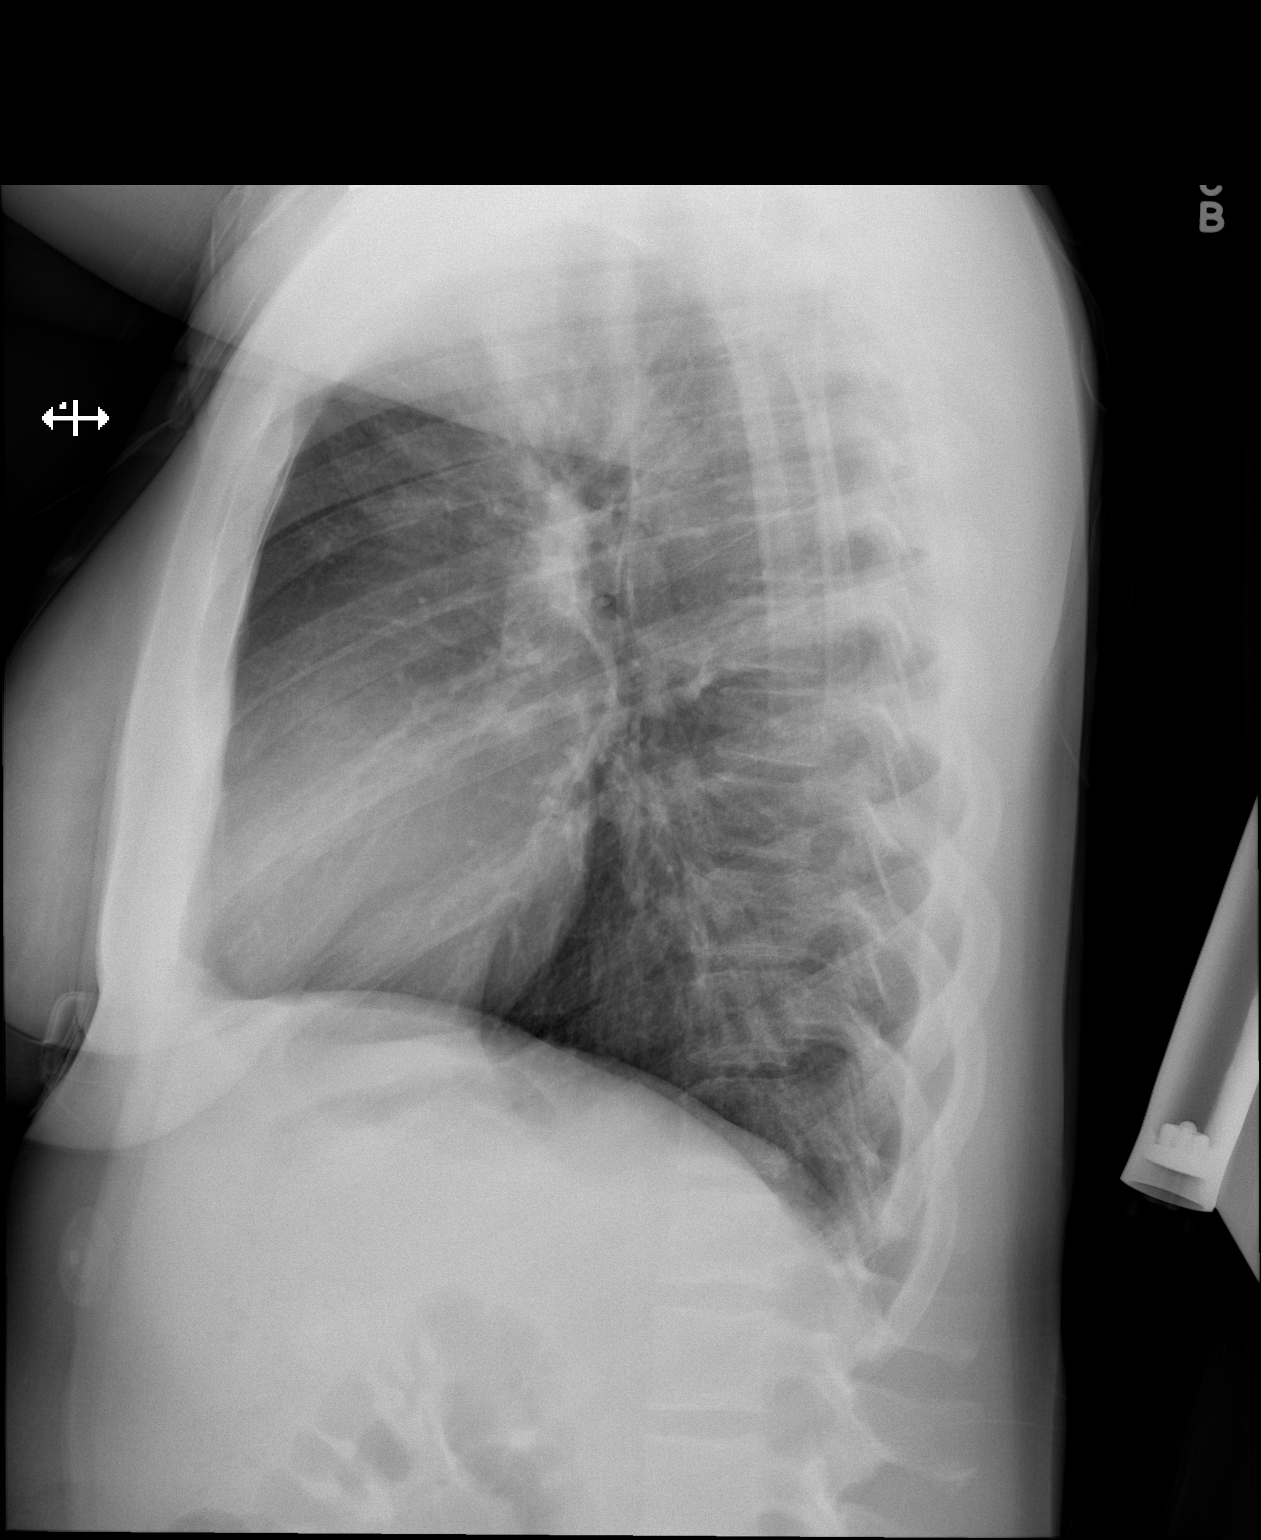

[w chest pa]
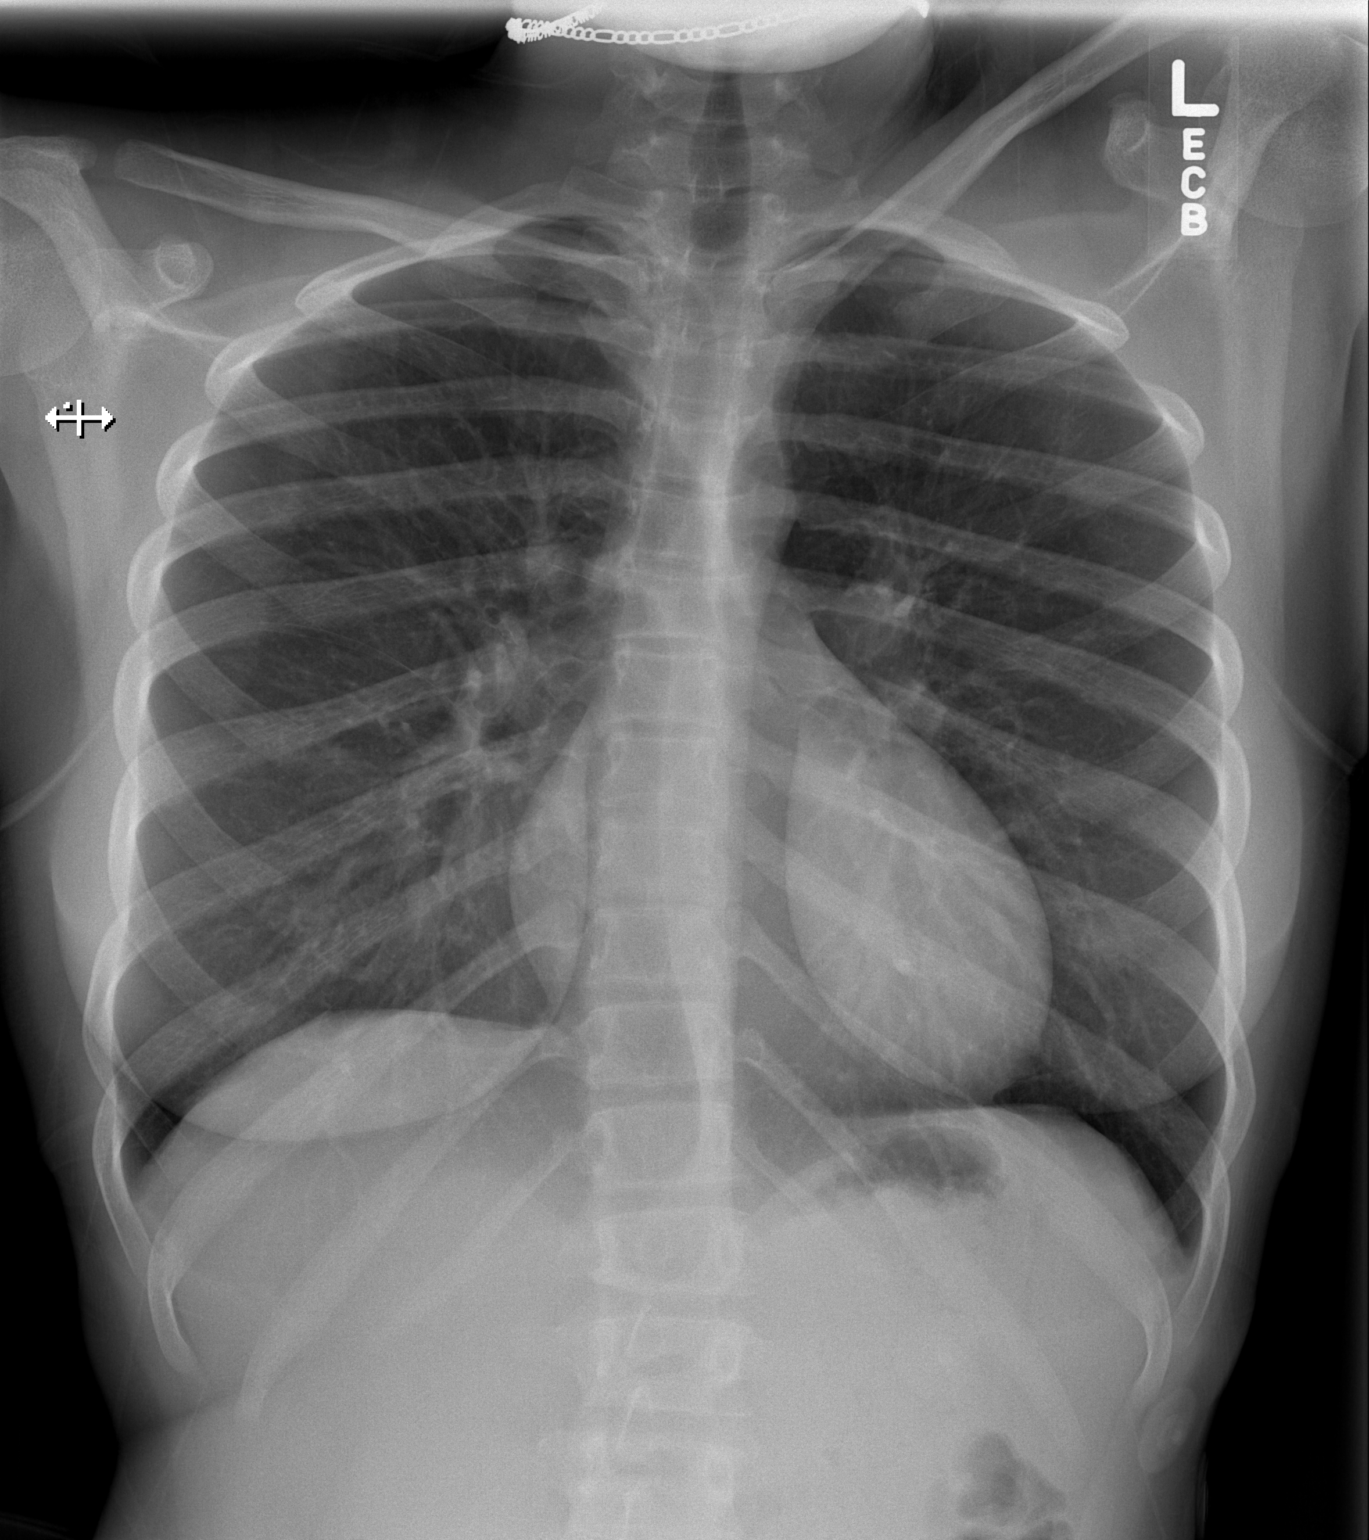

[2 of 2 positions shown; findings below may reference images not displayed]

FINDINGS: The heart size and mediastinal contours are within normal limits.
Both lungs are clear. The visualized skeletal structures are
unremarkable.
IMPRESSION: No active cardiopulmonary disease.

## 2019-10-19 DIAGNOSIS — Z20828 Contact with and (suspected) exposure to other viral communicable diseases: Secondary | ICD-10-CM | POA: Diagnosis not present

## 2019-10-26 DIAGNOSIS — M3214 Glomerular disease in systemic lupus erythematosus: Secondary | ICD-10-CM | POA: Diagnosis not present

## 2019-10-26 DIAGNOSIS — Z7952 Long term (current) use of systemic steroids: Secondary | ICD-10-CM | POA: Diagnosis not present

## 2019-10-26 DIAGNOSIS — Z79899 Other long term (current) drug therapy: Secondary | ICD-10-CM | POA: Diagnosis not present

## 2019-10-26 DIAGNOSIS — M328 Other forms of systemic lupus erythematosus: Secondary | ICD-10-CM | POA: Diagnosis not present

## 2019-10-26 DIAGNOSIS — L659 Nonscarring hair loss, unspecified: Secondary | ICD-10-CM | POA: Diagnosis not present

## 2019-11-18 ENCOUNTER — Ambulatory Visit: Payer: Medicaid Other | Attending: Internal Medicine

## 2019-11-18 DIAGNOSIS — Z20822 Contact with and (suspected) exposure to covid-19: Secondary | ICD-10-CM

## 2019-11-19 LAB — NOVEL CORONAVIRUS, NAA: SARS-CoV-2, NAA: NOT DETECTED

## 2019-11-21 ENCOUNTER — Ambulatory Visit: Payer: Medicaid Other | Attending: Internal Medicine

## 2019-11-21 DIAGNOSIS — Z20822 Contact with and (suspected) exposure to covid-19: Secondary | ICD-10-CM | POA: Diagnosis not present

## 2019-11-22 LAB — NOVEL CORONAVIRUS, NAA: SARS-CoV-2, NAA: NOT DETECTED

## 2019-11-24 DIAGNOSIS — Z03818 Encounter for observation for suspected exposure to other biological agents ruled out: Secondary | ICD-10-CM | POA: Diagnosis not present

## 2019-11-24 DIAGNOSIS — Z20828 Contact with and (suspected) exposure to other viral communicable diseases: Secondary | ICD-10-CM | POA: Diagnosis not present

## 2019-11-25 ENCOUNTER — Ambulatory Visit: Payer: Medicaid Other | Attending: Internal Medicine

## 2019-11-28 ENCOUNTER — Other Ambulatory Visit: Payer: Medicaid Other

## 2019-12-12 DIAGNOSIS — Z3042 Encounter for surveillance of injectable contraceptive: Secondary | ICD-10-CM | POA: Diagnosis not present

## 2019-12-13 DIAGNOSIS — M3214 Glomerular disease in systemic lupus erythematosus: Secondary | ICD-10-CM | POA: Diagnosis not present

## 2019-12-19 DIAGNOSIS — R197 Diarrhea, unspecified: Secondary | ICD-10-CM | POA: Diagnosis not present

## 2019-12-20 DIAGNOSIS — F411 Generalized anxiety disorder: Secondary | ICD-10-CM | POA: Diagnosis not present

## 2019-12-30 DIAGNOSIS — M3214 Glomerular disease in systemic lupus erythematosus: Secondary | ICD-10-CM | POA: Diagnosis not present

## 2019-12-30 DIAGNOSIS — Z23 Encounter for immunization: Secondary | ICD-10-CM | POA: Diagnosis not present

## 2019-12-30 DIAGNOSIS — Z79899 Other long term (current) drug therapy: Secondary | ICD-10-CM | POA: Diagnosis not present

## 2020-01-04 DIAGNOSIS — F54 Psychological and behavioral factors associated with disorders or diseases classified elsewhere: Secondary | ICD-10-CM | POA: Diagnosis not present

## 2020-01-04 DIAGNOSIS — F411 Generalized anxiety disorder: Secondary | ICD-10-CM | POA: Diagnosis not present

## 2020-01-11 DIAGNOSIS — F411 Generalized anxiety disorder: Secondary | ICD-10-CM | POA: Diagnosis not present

## 2020-01-18 DIAGNOSIS — F54 Psychological and behavioral factors associated with disorders or diseases classified elsewhere: Secondary | ICD-10-CM | POA: Diagnosis not present

## 2020-01-18 DIAGNOSIS — F411 Generalized anxiety disorder: Secondary | ICD-10-CM | POA: Diagnosis not present

## 2020-01-25 DIAGNOSIS — F411 Generalized anxiety disorder: Secondary | ICD-10-CM | POA: Diagnosis not present

## 2020-01-25 DIAGNOSIS — F54 Psychological and behavioral factors associated with disorders or diseases classified elsewhere: Secondary | ICD-10-CM | POA: Diagnosis not present

## 2020-02-01 DIAGNOSIS — F54 Psychological and behavioral factors associated with disorders or diseases classified elsewhere: Secondary | ICD-10-CM | POA: Diagnosis not present

## 2020-02-01 DIAGNOSIS — F411 Generalized anxiety disorder: Secondary | ICD-10-CM | POA: Diagnosis not present

## 2020-02-14 DIAGNOSIS — Z79899 Other long term (current) drug therapy: Secondary | ICD-10-CM | POA: Diagnosis not present

## 2020-02-15 DIAGNOSIS — M3214 Glomerular disease in systemic lupus erythematosus: Secondary | ICD-10-CM | POA: Diagnosis not present

## 2020-02-15 DIAGNOSIS — F54 Psychological and behavioral factors associated with disorders or diseases classified elsewhere: Secondary | ICD-10-CM | POA: Diagnosis not present

## 2020-02-15 DIAGNOSIS — F411 Generalized anxiety disorder: Secondary | ICD-10-CM | POA: Diagnosis not present

## 2020-02-21 DIAGNOSIS — M3214 Glomerular disease in systemic lupus erythematosus: Secondary | ICD-10-CM | POA: Diagnosis not present

## 2020-02-21 DIAGNOSIS — F411 Generalized anxiety disorder: Secondary | ICD-10-CM | POA: Diagnosis not present

## 2020-02-21 DIAGNOSIS — F54 Psychological and behavioral factors associated with disorders or diseases classified elsewhere: Secondary | ICD-10-CM | POA: Diagnosis not present

## 2020-02-29 DIAGNOSIS — Z79899 Other long term (current) drug therapy: Secondary | ICD-10-CM | POA: Diagnosis not present

## 2020-02-29 DIAGNOSIS — M3214 Glomerular disease in systemic lupus erythematosus: Secondary | ICD-10-CM | POA: Diagnosis not present

## 2020-02-29 DIAGNOSIS — I10 Essential (primary) hypertension: Secondary | ICD-10-CM | POA: Diagnosis not present

## 2020-03-06 DIAGNOSIS — F411 Generalized anxiety disorder: Secondary | ICD-10-CM | POA: Diagnosis not present

## 2020-03-13 DIAGNOSIS — M3214 Glomerular disease in systemic lupus erythematosus: Secondary | ICD-10-CM | POA: Diagnosis not present

## 2020-03-13 DIAGNOSIS — F54 Psychological and behavioral factors associated with disorders or diseases classified elsewhere: Secondary | ICD-10-CM | POA: Diagnosis not present

## 2020-03-13 DIAGNOSIS — F411 Generalized anxiety disorder: Secondary | ICD-10-CM | POA: Diagnosis not present

## 2020-03-20 DIAGNOSIS — Z79899 Other long term (current) drug therapy: Secondary | ICD-10-CM | POA: Diagnosis not present

## 2020-03-22 DIAGNOSIS — Z3042 Encounter for surveillance of injectable contraceptive: Secondary | ICD-10-CM | POA: Diagnosis not present

## 2020-03-23 DIAGNOSIS — Z20822 Contact with and (suspected) exposure to covid-19: Secondary | ICD-10-CM | POA: Diagnosis not present

## 2020-03-25 DIAGNOSIS — Z20822 Contact with and (suspected) exposure to covid-19: Secondary | ICD-10-CM | POA: Diagnosis not present

## 2020-03-25 DIAGNOSIS — Z20828 Contact with and (suspected) exposure to other viral communicable diseases: Secondary | ICD-10-CM | POA: Diagnosis not present

## 2020-04-03 DIAGNOSIS — F411 Generalized anxiety disorder: Secondary | ICD-10-CM | POA: Diagnosis not present

## 2020-04-11 DIAGNOSIS — F411 Generalized anxiety disorder: Secondary | ICD-10-CM | POA: Diagnosis not present

## 2020-04-11 DIAGNOSIS — F54 Psychological and behavioral factors associated with disorders or diseases classified elsewhere: Secondary | ICD-10-CM | POA: Diagnosis not present

## 2020-04-18 DIAGNOSIS — F411 Generalized anxiety disorder: Secondary | ICD-10-CM | POA: Diagnosis not present

## 2020-04-18 DIAGNOSIS — F54 Psychological and behavioral factors associated with disorders or diseases classified elsewhere: Secondary | ICD-10-CM | POA: Diagnosis not present

## 2020-04-24 DIAGNOSIS — F411 Generalized anxiety disorder: Secondary | ICD-10-CM | POA: Diagnosis not present

## 2020-04-25 DIAGNOSIS — Z130 Encounter for screening for diseases of the blood and blood-forming organs and certain disorders involving the immune mechanism: Secondary | ICD-10-CM | POA: Diagnosis not present

## 2020-04-25 DIAGNOSIS — Z3042 Encounter for surveillance of injectable contraceptive: Secondary | ICD-10-CM | POA: Diagnosis not present

## 2020-04-25 DIAGNOSIS — Z113 Encounter for screening for infections with a predominantly sexual mode of transmission: Secondary | ICD-10-CM | POA: Diagnosis not present

## 2020-04-25 DIAGNOSIS — R87810 Cervical high risk human papillomavirus (HPV) DNA test positive: Secondary | ICD-10-CM | POA: Diagnosis not present

## 2020-04-25 DIAGNOSIS — Z6823 Body mass index (BMI) 23.0-23.9, adult: Secondary | ICD-10-CM | POA: Diagnosis not present

## 2020-04-25 DIAGNOSIS — Z1151 Encounter for screening for human papillomavirus (HPV): Secondary | ICD-10-CM | POA: Diagnosis not present

## 2020-04-25 DIAGNOSIS — Z01419 Encounter for gynecological examination (general) (routine) without abnormal findings: Secondary | ICD-10-CM | POA: Diagnosis not present

## 2020-04-27 DIAGNOSIS — Z79899 Other long term (current) drug therapy: Secondary | ICD-10-CM | POA: Diagnosis not present

## 2020-04-27 DIAGNOSIS — M329 Systemic lupus erythematosus, unspecified: Secondary | ICD-10-CM | POA: Diagnosis not present

## 2020-05-01 DIAGNOSIS — F411 Generalized anxiety disorder: Secondary | ICD-10-CM | POA: Diagnosis not present

## 2020-05-29 DIAGNOSIS — R801 Persistent proteinuria, unspecified: Secondary | ICD-10-CM | POA: Diagnosis not present

## 2020-05-29 DIAGNOSIS — M329 Systemic lupus erythematosus, unspecified: Secondary | ICD-10-CM | POA: Diagnosis not present

## 2020-05-29 DIAGNOSIS — B359 Dermatophytosis, unspecified: Secondary | ICD-10-CM | POA: Diagnosis not present

## 2020-05-29 DIAGNOSIS — M328 Other forms of systemic lupus erythematosus: Secondary | ICD-10-CM | POA: Diagnosis not present

## 2020-05-29 DIAGNOSIS — I150 Renovascular hypertension: Secondary | ICD-10-CM | POA: Diagnosis not present

## 2020-05-29 DIAGNOSIS — M3214 Glomerular disease in systemic lupus erythematosus: Secondary | ICD-10-CM | POA: Diagnosis not present

## 2020-05-29 DIAGNOSIS — Z79899 Other long term (current) drug therapy: Secondary | ICD-10-CM | POA: Diagnosis not present

## 2020-06-13 DIAGNOSIS — Z309 Encounter for contraceptive management, unspecified: Secondary | ICD-10-CM | POA: Diagnosis not present

## 2020-08-29 DIAGNOSIS — N898 Other specified noninflammatory disorders of vagina: Secondary | ICD-10-CM | POA: Diagnosis not present

## 2020-08-29 DIAGNOSIS — Z113 Encounter for screening for infections with a predominantly sexual mode of transmission: Secondary | ICD-10-CM | POA: Diagnosis not present

## 2020-09-03 DIAGNOSIS — Z20822 Contact with and (suspected) exposure to covid-19: Secondary | ICD-10-CM | POA: Diagnosis not present

## 2020-09-04 DIAGNOSIS — Z3042 Encounter for surveillance of injectable contraceptive: Secondary | ICD-10-CM | POA: Diagnosis not present

## 2020-09-21 DIAGNOSIS — Z20822 Contact with and (suspected) exposure to covid-19: Secondary | ICD-10-CM | POA: Diagnosis not present

## 2020-10-17 DIAGNOSIS — Z5181 Encounter for therapeutic drug level monitoring: Secondary | ICD-10-CM | POA: Diagnosis not present

## 2020-10-17 DIAGNOSIS — R197 Diarrhea, unspecified: Secondary | ICD-10-CM | POA: Diagnosis not present

## 2020-10-17 DIAGNOSIS — R112 Nausea with vomiting, unspecified: Secondary | ICD-10-CM | POA: Diagnosis not present

## 2020-10-17 DIAGNOSIS — M329 Systemic lupus erythematosus, unspecified: Secondary | ICD-10-CM | POA: Diagnosis not present

## 2020-10-19 DIAGNOSIS — M329 Systemic lupus erythematosus, unspecified: Secondary | ICD-10-CM | POA: Diagnosis not present

## 2020-10-19 DIAGNOSIS — R197 Diarrhea, unspecified: Secondary | ICD-10-CM | POA: Diagnosis not present

## 2020-10-19 DIAGNOSIS — R112 Nausea with vomiting, unspecified: Secondary | ICD-10-CM | POA: Diagnosis not present

## 2020-10-19 DIAGNOSIS — Z5181 Encounter for therapeutic drug level monitoring: Secondary | ICD-10-CM | POA: Diagnosis not present

## 2020-10-25 DIAGNOSIS — M329 Systemic lupus erythematosus, unspecified: Secondary | ICD-10-CM | POA: Diagnosis not present

## 2020-10-25 DIAGNOSIS — Z79899 Other long term (current) drug therapy: Secondary | ICD-10-CM | POA: Diagnosis not present

## 2020-10-29 ENCOUNTER — Other Ambulatory Visit: Payer: Medicaid Other

## 2020-10-29 DIAGNOSIS — Z20822 Contact with and (suspected) exposure to covid-19: Secondary | ICD-10-CM

## 2020-10-30 ENCOUNTER — Other Ambulatory Visit: Payer: Medicaid Other

## 2020-10-31 LAB — SARS-COV-2, NAA 2 DAY TAT

## 2020-10-31 LAB — NOVEL CORONAVIRUS, NAA: SARS-CoV-2, NAA: DETECTED — AB

## 2020-11-05 ENCOUNTER — Other Ambulatory Visit: Payer: BC Managed Care – PPO

## 2020-11-05 DIAGNOSIS — Z20822 Contact with and (suspected) exposure to covid-19: Secondary | ICD-10-CM

## 2020-11-06 LAB — NOVEL CORONAVIRUS, NAA: SARS-CoV-2, NAA: DETECTED — AB

## 2020-11-06 LAB — SARS-COV-2, NAA 2 DAY TAT

## 2020-11-12 ENCOUNTER — Other Ambulatory Visit: Payer: Self-pay

## 2020-11-12 ENCOUNTER — Ambulatory Visit (HOSPITAL_COMMUNITY)
Admission: EM | Admit: 2020-11-12 | Discharge: 2020-11-12 | Disposition: A | Discharge status: Home / Self Care | Payer: BC Managed Care – PPO | Attending: Emergency Medicine | Admitting: Emergency Medicine

## 2020-11-12 ENCOUNTER — Encounter (HOSPITAL_COMMUNITY): Payer: Self-pay

## 2020-11-12 DIAGNOSIS — B349 Viral infection, unspecified: Secondary | ICD-10-CM

## 2020-11-12 DIAGNOSIS — J029 Acute pharyngitis, unspecified: Secondary | ICD-10-CM

## 2020-11-12 LAB — POCT RAPID STREP A, ED / UC: Streptococcus, Group A Screen (Direct): NEGATIVE

## 2020-11-12 NOTE — Discharge Instructions (Addendum)
Your rapid strep test is negative.  A throat culture is pending; we will call you if it is positive requiring treatment.    Follow up with your primary care provider if your symptoms are not improving.    

## 2020-11-12 NOTE — ED Provider Notes (Signed)
[facility]    CSN: [id number] Arrival date & time: 11/12/20  9381      History   Chief Complaint Chief Complaint  Patient presents with  . Sore Throat  . Headache    HPI Darlene Ruiz is a 25 y.o. female.   Patient presents with sore throat and headache x1 day.  She also reports 2 episodes of emesis yesterday; none today.  She also reports ongoing nonproductive cough since having COVID in late December.  Treatment attempted at home with Benadryl.  She denies fever, chills, rash, shortness of breath, diarrhea, or other symptoms.  She was COVID positive on 10/29/2020 and 11/04/2020.  Her medical history includes Lupus, HTN, lupus nephritis.   The history is provided by the patient and medical records.    Past Medical History:  Diagnosis Date  . Hypertension   . Lupus (HCC)   . Renal disorder     There are no problems to display for this patient.   Past Surgical History:  Procedure Laterality Date  . RENAL BIOPSY      OB History   No obstetric history on file.      Home Medications    Prior to Admission medications   Medication Sig Start Date End Date Taking? Authorizing Provider  azaTHIOprine (IMURAN) 50 MG tablet 2 tablets 11/26/16  Yes [provider]  azithromycin (ZITHROMAX) 250 MG tablet 2 tablet  on the first day, then 1 tablet daily for 4 days 11/06/20  Yes [provider]  amoxicillin (AMOXIL) 875 MG tablet Take 1 tablet (875 mg total) by mouth 2 (two) times daily. 04/25/18   Elvina Sidle, MD  Cytomegalovirus Immune Glob (CYTOGAM IV) Inject into the vein every 3 (three) months.    [provider]  folic acid (FOLVITE) 1 MG tablet Take 1 mg by mouth daily.    [provider]  Guaifenesin 1200 MG TB12 Take 1 tablet (1,200 mg total) by mouth 2 (two) times daily. 10/26/16   Lawyer, Cristal Deer, PA-C  hydroxychloroquine (PLAQUENIL) 200 MG tablet Take 300 mg by mouth daily.    [provider]  ibuprofen  (ADVIL,MOTRIN) 800 MG tablet Take 1 tablet (800 mg total) by mouth every 8 (eight) hours as needed. 10/26/16   Lawyer, Cristal Deer, PA-C  lisinopril (PRINIVIL,ZESTRIL) 10 MG tablet Take 10 mg by mouth daily.    [provider]  loratadine (CLARITIN) 10 MG tablet Take 10 mg by mouth daily as needed. allergies    [provider]  montelukast (SINGULAIR) 10 MG tablet Take 10 mg by mouth daily as needed. allergies    [provider]  pimecrolimus (ELIDEL) 1 % cream Apply 1 application topically daily as needed. Skin rash    [provider]  promethazine-dextromethorphan (PROMETHAZINE-DM) 6.25-15 MG/5ML syrup Take 5 mLs by mouth 4 (four) times daily as needed for cough. 10/26/16   Lawyer, Cristal Deer, PA-C  sulfacetamide (BLEPH-10) 10 % ophthalmic ointment 2 drops in both eyes  Every 3 hrs x 7 days 06/16/15   Janit Pagan T, MD  TACROLIMUS PO Take by mouth 2 (two) times daily.    [provider]  traMADol (ULTRAM) 50 MG tablet Take 1 tablet (50 mg total) by mouth every 6 (six) hours as needed. 02/06/16   Charlestine Night, PA-C    Family History Family History  Problem Relation Age of Onset  . Hypertension Other     Social History Social History   Tobacco Use  . Smoking status:  Never Smoker  . Smokeless tobacco: Never Used  Substance Use Topics  . Alcohol use: No  . Drug use: No     Allergies   Amoxicillin   Review of Systems Review of Systems  Constitutional: Negative for chills and fever.  HENT: Positive for rhinorrhea and sore throat. Negative for ear pain.   Eyes: Negative for pain and visual disturbance.  Respiratory: Positive for cough. Negative for shortness of breath.   Cardiovascular: Negative for chest pain and palpitations.  Gastrointestinal: Positive for vomiting. Negative for abdominal pain and diarrhea.  Genitourinary: Negative for dysuria and hematuria.  Musculoskeletal: Negative for arthralgias and back pain.  Skin:  Negative for color change and rash.  Neurological: Positive for headaches. Negative for dizziness, syncope, weakness and numbness.  All other systems reviewed and are negative.    Physical Exam Triage Vital Signs ED Triage Vitals  Enc Vitals Group     BP      Pulse      Resp      Temp      Temp src      SpO2      Weight      Height      Head Circumference      Peak Flow      Pain Score      Pain Loc      Pain Edu?      Excl. in GC?    No data found.  Updated Vital Signs BP 117/83 (BP Location: Left Arm)   Pulse 79   Temp 99 F (37.2 C) (Oral)   Resp 17   SpO2 97%   Visual Acuity Right Eye Distance:   Left Eye Distance:   Bilateral Distance:    Right Eye Near:   Left Eye Near:    Bilateral Near:     Physical Exam Vitals and nursing note reviewed.  Constitutional:      General: She is not in acute distress.    Appearance: She is well-developed and well-nourished. She is not ill-appearing.  HENT:     Head: Normocephalic and atraumatic.     Right Ear: Tympanic membrane normal.     Left Ear: Tympanic membrane normal.     Nose: Rhinorrhea present.     Mouth/Throat:     Mouth: Mucous membranes are moist.     Pharynx: Posterior oropharyngeal erythema present. No oropharyngeal exudate.  Eyes:     Conjunctiva/sclera: Conjunctivae normal.  Cardiovascular:     Rate and Rhythm: Normal rate and regular rhythm.     Heart sounds: Normal heart sounds.  Pulmonary:     Effort: Pulmonary effort is normal. No respiratory distress.     Breath sounds: Normal breath sounds.  Abdominal:     Palpations: Abdomen is soft.     Tenderness: There is no abdominal tenderness. There is no guarding or rebound.  Musculoskeletal:        General: No edema.     Cervical back: Neck supple.  Skin:    General: Skin is warm and dry.     Findings: No rash.  Neurological:     General: No focal deficit present.     Mental Status: She is alert and oriented to person, place, and time.      Gait: Gait normal.  Psychiatric:        Mood and Affect: Mood and affect and mood normal.        Behavior: Behavior normal.  UC Treatments / Results  Labs (all labs ordered are listed, but only abnormal results are displayed) Labs Reviewed  POCT RAPID STREP A, ED / UC    EKG   Radiology No results found.  Procedures Procedures (including critical care time)  Medications Ordered in UC Medications - No data to display  Initial Impression / Assessment and Plan / UC Course  I have reviewed the triage vital signs and the nursing notes.  Pertinent labs & imaging results that were available during my care of the patient were reviewed by me and considered in my medical decision making (see chart for details).   Sore throat, Viral illness. Rapid strep negative; culture pending.  Discussed symptomatic treatment.  Instructed patient to follow-up with her PCP if her symptoms are not improving.  She agrees to plan of care.   Final Clinical Impressions(s) / UC Diagnoses   Final diagnoses:  Sore throat  Viral illness     Discharge Instructions     Your rapid strep test is negative.  A throat culture is pending; we will call you if it is positive requiring treatment.    Follow up with your primary care provider if your symptoms are not improving.       ED Prescriptions    None     PDMP not reviewed this encounter.   Mickie Bail, NP 11/12/20 [phone]

## 2020-11-12 NOTE — ED Triage Notes (Addendum)
Pt presents with sore throat and headache x 1 day. States tested positive for COVID on 10/31/2020. Denies fever.   Pt taking azotomycin, started 3 days ago.

## 2020-11-13 LAB — CULTURE, GROUP A STREP (THRC)

## 2020-11-14 LAB — CULTURE, GROUP A STREP (THRC)

## 2020-11-27 DIAGNOSIS — Z3042 Encounter for surveillance of injectable contraceptive: Secondary | ICD-10-CM | POA: Diagnosis not present

## 2020-12-11 DIAGNOSIS — E559 Vitamin D deficiency, unspecified: Secondary | ICD-10-CM | POA: Diagnosis not present

## 2020-12-11 DIAGNOSIS — R7989 Other specified abnormal findings of blood chemistry: Secondary | ICD-10-CM | POA: Diagnosis not present

## 2020-12-11 DIAGNOSIS — M328 Other forms of systemic lupus erythematosus: Secondary | ICD-10-CM | POA: Diagnosis not present

## 2020-12-11 DIAGNOSIS — Z0000 Encounter for general adult medical examination without abnormal findings: Secondary | ICD-10-CM | POA: Diagnosis not present

## 2020-12-11 DIAGNOSIS — Z23 Encounter for immunization: Secondary | ICD-10-CM | POA: Diagnosis not present

## 2020-12-11 DIAGNOSIS — R801 Persistent proteinuria, unspecified: Secondary | ICD-10-CM | POA: Diagnosis not present

## 2020-12-11 DIAGNOSIS — R7401 Elevation of levels of liver transaminase levels: Secondary | ICD-10-CM | POA: Diagnosis not present

## 2020-12-11 DIAGNOSIS — M3214 Glomerular disease in systemic lupus erythematosus: Secondary | ICD-10-CM | POA: Diagnosis not present

## 2020-12-11 DIAGNOSIS — I150 Renovascular hypertension: Secondary | ICD-10-CM | POA: Diagnosis not present

## 2020-12-11 DIAGNOSIS — F32A Depression, unspecified: Secondary | ICD-10-CM | POA: Diagnosis not present

## 2021-01-08 DIAGNOSIS — Z79899 Other long term (current) drug therapy: Secondary | ICD-10-CM | POA: Diagnosis not present

## 2021-01-08 DIAGNOSIS — R7989 Other specified abnormal findings of blood chemistry: Secondary | ICD-10-CM | POA: Diagnosis not present

## 2021-01-08 DIAGNOSIS — F419 Anxiety disorder, unspecified: Secondary | ICD-10-CM | POA: Diagnosis not present

## 2021-01-08 DIAGNOSIS — Z23 Encounter for immunization: Secondary | ICD-10-CM | POA: Diagnosis not present

## 2021-01-08 DIAGNOSIS — M3214 Glomerular disease in systemic lupus erythematosus: Secondary | ICD-10-CM | POA: Diagnosis not present

## 2021-01-08 DIAGNOSIS — Z1331 Encounter for screening for depression: Secondary | ICD-10-CM | POA: Diagnosis not present

## 2021-01-23 DIAGNOSIS — M3214 Glomerular disease in systemic lupus erythematosus: Secondary | ICD-10-CM | POA: Diagnosis not present

## 2021-01-28 DIAGNOSIS — Z20822 Contact with and (suspected) exposure to covid-19: Secondary | ICD-10-CM | POA: Diagnosis not present

## 2021-01-30 ENCOUNTER — Other Ambulatory Visit: Payer: Self-pay

## 2021-01-30 ENCOUNTER — Encounter (HOSPITAL_BASED_OUTPATIENT_CLINIC_OR_DEPARTMENT_OTHER): Payer: Self-pay

## 2021-01-30 ENCOUNTER — Emergency Department (HOSPITAL_BASED_OUTPATIENT_CLINIC_OR_DEPARTMENT_OTHER): Payer: BC Managed Care – PPO

## 2021-01-30 ENCOUNTER — Inpatient Hospital Stay (HOSPITAL_BASED_OUTPATIENT_CLINIC_OR_DEPARTMENT_OTHER)
Admission: EM | Admit: 2021-01-30 | Discharge: 2021-02-01 | DRG: 392 | Disposition: A | Discharge status: Home / Self Care | Payer: BC Managed Care – PPO | Attending: Internal Medicine | Admitting: Internal Medicine

## 2021-01-30 DIAGNOSIS — D849 Immunodeficiency, unspecified: Secondary | ICD-10-CM | POA: Diagnosis present

## 2021-01-30 DIAGNOSIS — Z8249 Family history of ischemic heart disease and other diseases of the circulatory system: Secondary | ICD-10-CM

## 2021-01-30 DIAGNOSIS — N12 Tubulo-interstitial nephritis, not specified as acute or chronic: Secondary | ICD-10-CM | POA: Diagnosis not present

## 2021-01-30 DIAGNOSIS — Z20822 Contact with and (suspected) exposure to covid-19: Secondary | ICD-10-CM | POA: Diagnosis present

## 2021-01-30 DIAGNOSIS — E876 Hypokalemia: Secondary | ICD-10-CM | POA: Diagnosis not present

## 2021-01-30 DIAGNOSIS — I10 Essential (primary) hypertension: Secondary | ICD-10-CM | POA: Diagnosis present

## 2021-01-30 DIAGNOSIS — Z7952 Long term (current) use of systemic steroids: Secondary | ICD-10-CM | POA: Diagnosis not present

## 2021-01-30 DIAGNOSIS — Z79899 Other long term (current) drug therapy: Secondary | ICD-10-CM | POA: Diagnosis not present

## 2021-01-30 DIAGNOSIS — Z880 Allergy status to penicillin: Secondary | ICD-10-CM | POA: Diagnosis not present

## 2021-01-30 DIAGNOSIS — A084 Viral intestinal infection, unspecified: Principal | ICD-10-CM | POA: Diagnosis present

## 2021-01-30 DIAGNOSIS — M3214 Glomerular disease in systemic lupus erythematosus: Secondary | ICD-10-CM | POA: Diagnosis not present

## 2021-01-30 DIAGNOSIS — R509 Fever, unspecified: Secondary | ICD-10-CM | POA: Diagnosis not present

## 2021-01-30 DIAGNOSIS — K529 Noninfective gastroenteritis and colitis, unspecified: Secondary | ICD-10-CM | POA: Diagnosis present

## 2021-01-30 DIAGNOSIS — M329 Systemic lupus erythematosus, unspecified: Secondary | ICD-10-CM | POA: Diagnosis not present

## 2021-01-30 DIAGNOSIS — D61818 Other pancytopenia: Secondary | ICD-10-CM | POA: Diagnosis present

## 2021-01-30 DIAGNOSIS — R6510 Systemic inflammatory response syndrome (SIRS) of non-infectious origin without acute organ dysfunction: Secondary | ICD-10-CM | POA: Diagnosis not present

## 2021-01-30 DIAGNOSIS — A419 Sepsis, unspecified organism: Secondary | ICD-10-CM | POA: Diagnosis not present

## 2021-01-30 LAB — COMPREHENSIVE METABOLIC PANEL
ALT: 21 U/L (ref 0–44)
AST: 31 U/L (ref 15–41)
Albumin: 4.1 g/dL (ref 3.5–5.0)
Alkaline Phosphatase: 60 U/L (ref 38–126)
Anion gap: 11 (ref 5–15)
BUN: 14 mg/dL (ref 6–20)
CO2: 20 mmol/L — ABNORMAL LOW (ref 22–32)
Calcium: 8.7 mg/dL — ABNORMAL LOW (ref 8.9–10.3)
Chloride: 100 mmol/L (ref 98–111)
Creatinine, Ser: 0.93 mg/dL (ref 0.44–1.00)
GFR, Estimated: >60 mL/min (ref >60)
Glucose, Bld: 104 mg/dL — ABNORMAL HIGH (ref 70–99)
Potassium: 3.4 mmol/L — ABNORMAL LOW (ref 3.5–5.1)
Sodium: 131 mmol/L — ABNORMAL LOW (ref 135–145)
Total Bilirubin: 0.7 mg/dL (ref 0.3–1.2)
Total Protein: 7.1 g/dL (ref 6.5–8.1)

## 2021-01-30 LAB — CBC WITH DIFFERENTIAL/PLATELET
Abs Immature Granulocytes: 0.03 10*3/uL (ref 0.00–0.07)
Basophils Absolute: 0 10*3/uL (ref 0.0–0.1)
Basophils Relative: 0 %
Eosinophils Absolute: 0 10*3/uL (ref 0.0–0.5)
Eosinophils Relative: 0 %
HCT: 37.6 % (ref 36.0–46.0)
Hemoglobin: 13.2 g/dL (ref 12.0–15.0)
Immature Granulocytes: 1 %
Lymphocytes Relative: 7 %
Lymphs Abs: 0.4 10*3/uL — ABNORMAL LOW (ref 0.7–4.0)
MCH: 30.4 pg (ref 26.0–34.0)
MCHC: 35.1 g/dL (ref 30.0–36.0)
MCV: 86.6 fL (ref 80.0–100.0)
Monocytes Absolute: 0.1 10*3/uL (ref 0.1–1.0)
Monocytes Relative: 2 %
Neutro Abs: 5.3 10*3/uL (ref 1.7–7.7)
Neutrophils Relative %: 90 %
Platelets: 153 10*3/uL (ref 150–400)
RBC: 4.34 MIL/uL (ref 3.87–5.11)
RDW: 12.7 % (ref 11.5–15.5)
WBC: 6 10*3/uL (ref 4.0–10.5)
nRBC: 0 % (ref 0.0–0.2)

## 2021-01-30 LAB — URINALYSIS, MICROSCOPIC (REFLEX)

## 2021-01-30 LAB — GROUP A STREP BY PCR: Group A Strep by PCR: NOT DETECTED

## 2021-01-30 LAB — URINALYSIS, ROUTINE W REFLEX MICROSCOPIC
Bilirubin Urine: NEGATIVE
Glucose, UA: NEGATIVE mg/dL
Hgb urine dipstick: NEGATIVE
Ketones, ur: 15 mg/dL — AB
Nitrite: NEGATIVE
Protein, ur: 30 mg/dL — AB
Specific Gravity, Urine: 1.01 (ref 1.005–1.030)
pH: 7 (ref 5.0–8.0)

## 2021-01-30 LAB — RESP PANEL BY RT-PCR (FLU A&B, COVID) ARPGX2
Influenza A by PCR: NEGATIVE
Influenza B by PCR: NEGATIVE
SARS Coronavirus 2 by RT PCR: NEGATIVE

## 2021-01-30 LAB — APTT: aPTT: 29 seconds (ref 24–36)

## 2021-01-30 LAB — LACTIC ACID, PLASMA: Lactic Acid, Venous: 1 mmol/L (ref 0.5–1.9)

## 2021-01-30 LAB — PROTIME-INR
INR: 1.1 (ref 0.8–1.2)
Prothrombin Time: 13.8 seconds (ref 11.4–15.2)

## 2021-01-30 MED ORDER — LACTATED RINGERS IV BOLUS (SEPSIS)
250.0000 mL | Freq: Once | INTRAVENOUS | Status: AC
  Administered 2021-01-30 (×2): 250 mL via INTRAVENOUS

## 2021-01-30 MED ORDER — SODIUM CHLORIDE 0.9 % IV SOLN: INTRAVENOUS | Status: DC | PRN

## 2021-01-30 MED ORDER — PREDNISONE 20 MG PO TABS
20.0000 mg | ORAL_TABLET | Freq: Once | ORAL | Status: DC
  Filled 2021-01-30: qty 1

## 2021-01-30 MED ORDER — LACTATED RINGERS IV BOLUS (SEPSIS)
1000.0000 mL | Freq: Once | INTRAVENOUS | Status: AC
  Administered 2021-01-30: 1000 mL via INTRAVENOUS

## 2021-01-30 MED ORDER — VANCOMYCIN HCL IN DEXTROSE 1-5 GM/200ML-% IV SOLN
1000.0000 mg | Freq: Once | INTRAVENOUS | Status: AC
  Administered 2021-01-30: 1000 mg via INTRAVENOUS
  Filled 2021-01-30: qty 200

## 2021-01-30 MED ORDER — LACTATED RINGERS IV BOLUS (SEPSIS)
500.0000 mL | Freq: Once | INTRAVENOUS | Status: AC
  Administered 2021-01-30: 500 mL via INTRAVENOUS

## 2021-01-30 MED ORDER — SODIUM CHLORIDE 0.9 % IV SOLN: 2.0000 g | Freq: Once | INTRAVENOUS | Status: DC

## 2021-01-30 MED ORDER — SODIUM CHLORIDE 0.9 % IV SOLN
2.0000 g | Freq: Once | INTRAVENOUS | Status: AC
  Administered 2021-01-30: 2 g via INTRAVENOUS
  Filled 2021-01-30: qty 2

## 2021-01-30 NOTE — ED Notes (Signed)
Covid Swab and Strep Throat Swab obtained and to the lab

## 2021-01-30 NOTE — ED Notes (Signed)
Handoff report given to [facility] 

## 2021-01-30 NOTE — ED Notes (Signed)
Pt able to eat and drink per EDP Little. Pt requests pnut butter crackers and sprite.

## 2021-01-30 NOTE — ED Provider Notes (Signed)
[facility] EMERGENCY DEPARTMENT Provider Note   CSN: [id number] Arrival date & time: 01/30/21  1353     History Chief Complaint  Patient presents with  . Fatigue    Darlene Ruiz is a 25 y.o. female.  Pt has a history of lupus.  Pt reports she has had a recent flare.  Pt received 500mg  of solumedrol on 3/23.  Pt reports feeling bad since infusion. Pt reports she also has glomerular disease.    The history is provided by the patient. No language interpreter was used.  Fever Max temp prior to arrival:  102 Temp source:  Oral Severity:  Moderate Onset quality:  Gradual Duration:  5 days Timing:  Constant Progression:  Worsening Chronicity:  New Relieved by:  Nothing Worsened by:  Nothing Associated symptoms: chills and rash   Risk factors: no sick contacts        Past Medical History:  Diagnosis Date  . Hypertension   . Lupus (HCC)   . Renal disorder     There are no problems to display for this patient.   Past Surgical History:  Procedure Laterality Date  . RENAL BIOPSY       OB History   No obstetric history on file.     Family History  Problem Relation Age of Onset  . Hypertension Other     Social History   Tobacco Use  . Smoking status: Never Smoker  . Smokeless tobacco: Never Used  Substance Use Topics  . Alcohol use: No  . Drug use: No    Home Medications Prior to Admission medications   Medication Sig Start Date End Date Taking? Authorizing Provider  azaTHIOprine (IMURAN) 50 MG tablet 2 tablets 11/26/16   [provider]  azithromycin (ZITHROMAX) 250 MG tablet 2 tablet  on the first day, then 1 tablet daily for 4 days 11/06/20   [provider]  Cytomegalovirus Immune Glob (CYTOGAM IV) Inject into the vein every 3 (three) months.    [provider]  folic acid (FOLVITE) 1 MG tablet Take 1 mg by mouth daily.    [provider]  hydroxychloroquine (PLAQUENIL) 200 MG tablet Take 300 mg by  mouth daily.    [provider]  lisinopril (PRINIVIL,ZESTRIL) 10 MG tablet Take 10 mg by mouth daily.    [provider]  loratadine (CLARITIN) 10 MG tablet Take 10 mg by mouth daily as needed. allergies    [provider]  montelukast (SINGULAIR) 10 MG tablet Take 10 mg by mouth daily as needed. allergies    [provider]  pimecrolimus (ELIDEL) 1 % cream Apply 1 application topically daily as needed. Skin rash    [provider]  sulfacetamide (BLEPH-10) 10 % ophthalmic ointment 2 drops in both eyes  Every 3 hrs x 7 days 06/16/15   06/18/15 T, MD  TACROLIMUS PO Take by mouth 2 (two) times daily.    [provider]    Allergies    Amoxicillin  Review of Systems   Review of Systems  Constitutional: Positive for chills and fever.  Skin: Positive for rash.    Physical Exam Updated Vital Signs BP 101/69 (BP Location: Right Arm)   Pulse 93   Temp (!) 101.9 F (38.8 C) (Oral)   Resp 16   Ht 4\' 8"  (1.422 m)   Wt 50.8 kg   SpO2 100%   BMI 25.11 kg/m   Physical Exam Vitals and nursing note reviewed.  Constitutional:      Appearance: She is well-developed.  HENT:     Head: Normocephalic.     Comments: Rash face,     Nose: Nose normal.  Cardiovascular:     Rate and Rhythm: Normal rate.  Pulmonary:     Effort: Pulmonary effort is normal.  Abdominal:     General: Abdomen is flat. There is no distension.     Palpations: Abdomen is soft.  Musculoskeletal:        General: Normal range of motion.     Cervical back: Normal range of motion.  Skin:    Comments: Rash full body,    Neurological:     General: No focal deficit present.     Mental Status: She is alert and oriented to person, place, and time.  Psychiatric:        Mood and Affect: Mood normal.     ED Results / Procedures / Treatments   Labs (all labs ordered are listed, but only abnormal results are displayed) Labs Reviewed  CBC WITH  DIFFERENTIAL/PLATELET - Abnormal; Notable for the following components:      Result Value   Lymphs Abs 0.4 (*)    All other components within normal limits  COMPREHENSIVE METABOLIC PANEL - Abnormal; Notable for the following components:   Sodium 131 (*)    Potassium 3.4 (*)    CO2 20 (*)    Glucose, Bld 104 (*)    Calcium 8.7 (*)    All other components within normal limits  URINALYSIS, ROUTINE W REFLEX MICROSCOPIC - Abnormal; Notable for the following components:   Ketones, ur 15 (*)    Protein, ur 30 (*)    Leukocytes,Ua MODERATE (*)    All other components within normal limits  URINALYSIS, MICROSCOPIC (REFLEX) - Abnormal; Notable for the following components:   Bacteria, UA MANY (*)    All other components within normal limits  GROUP A STREP BY PCR  RESP PANEL BY RT-PCR (FLU A&B, COVID) ARPGX2  CULTURE, BLOOD (ROUTINE X 2)  CULTURE, BLOOD (ROUTINE X 2)  LACTIC ACID, PLASMA  PROTIME-INR  APTT  LACTIC ACID, PLASMA    EKG None  Radiology DG Chest Port 1 View  Result Date: 01/30/2021 CLINICAL DATA:  Fever EXAM: PORTABLE CHEST 1 VIEW COMPARISON:  10/07/2017 FINDINGS: No consolidation, features of edema, pneumothorax, or effusion. Pulmonary vascularity is normally distributed. The cardiomediastinal contours are unremarkable. Metallic bilateral nipple ornamentation is noted. Corticated mineralization seen adjacent the left coracoid, possibly developmental given appearance on multiple prior exams. No other acute osseous or soft tissue abnormality. IMPRESSION: No acute cardiopulmonary abnormality. Electronically Signed   By: Kreg Shropshire M.D.   On: 01/30/2021 16:19    Procedures Procedures   Medications Ordered in ED Medications  0.9 %  sodium chloride infusion ( Intravenous New Bag/Given 01/30/21 1503)  lactated ringers bolus 1,000 mL (0 mLs Intravenous Stopped 01/30/21 1603)    And  lactated ringers bolus 500 mL (0 mLs Intravenous Stopped 01/30/21 1734)    And  lactated  ringers bolus 250 mL (250 mLs Intravenous New Bag/Given 01/30/21 1744)  vancomycin (VANCOCIN) IVPB 1000 mg/200 mL premix (0 mg Intravenous Stopped 01/30/21 1744)  ceFEPIme (MAXIPIME) 2 g in sodium chloride 0.9 % 100 mL IVPB (0 g Intravenous Stopped 01/30/21 1603)    ED Course  I have reviewed the triage vital signs and the nursing notes.  Pertinent labs & imaging results that were available during my care of the patient were  reviewed by me and considered in my medical decision making (see chart for details).  Clinical Course as of 01/30/21 1807  Wed Jan 30, 2021  1730 Glori LuisMarland Kitchen): MODERATE [LS]    Clinical Course User Index [LS] Elson Areas, PA-C   MDM Rules/Calculators/A&P                          MDM:  Pt reports rash is worse than usual.  Sepsis orders initiated,  Ua shows 21-50 wbc's and many bacteria  Lactic acid is normal.  Pt given IV antibiotics and fluids   Pt feels better after antibiotics and IV fluids.  I spoke with hospitalist Dr. Allena Katz at [facility] who advised pt would need admission at [facility] where she is followed.  [facility] advised they will call.  They currently have no beds.   Pt's rheumatologist paged  Dr. Cleaster Corin advised observation x 24.  Hold prograf and Imuran until 24 hours post fever and 48 hours post 1st antibiotic dose.  She advised continue prednisone 20 mg.  She does not feel pt needs current rheumatologic services.  She is available at [phone]  re consult to hospitalist  Dr. Allena Katz will admit  Final Clinical Impression(s) / ED Diagnoses Final diagnoses:  Pyelonephritis  Systemic lupus erythematosus, unspecified SLE type, unspecified organ involvement status [facility])    Rx / DC Orders ED Discharge Orders    None       Osie Cheeks 01/30/21 2045    Little, Ambrose Finland, MD 02/04/21 [phone]

## 2021-01-30 NOTE — ED Notes (Signed)
BC X 2 OBTAINED PRIOR TO ABX ADMINISTRATION 

## 2021-01-30 NOTE — ED Notes (Signed)
IV started left arm and blood drawn and placed in lab on hold

## 2021-01-30 NOTE — ED Triage Notes (Addendum)
Pt c/o fatigue, chills, body aches x 5 days-states she feels is r/t to lupus with worsening rash to face-states she had neg covid test at [facility] yesterday-NAD-steady gait

## 2021-01-30 NOTE — ED Notes (Signed)
[facility] at bedside 

## 2021-01-30 NOTE — ED Notes (Signed)
Handoff report given to [facility] 1300 FLOOR rn

## 2021-01-30 NOTE — ED Notes (Signed)
Patient states feeling tied and weak without much of an appetite since Saturday.  Noted rash to face with sopratic rash to hands arms.

## 2021-01-30 NOTE — ED Notes (Signed)
Notified lab of request to do urine culture

## 2021-01-30 NOTE — ED Notes (Signed)
ED Provider at bedside. 

## 2021-01-31 DIAGNOSIS — Z8249 Family history of ischemic heart disease and other diseases of the circulatory system: Secondary | ICD-10-CM | POA: Diagnosis not present

## 2021-01-31 DIAGNOSIS — N12 Tubulo-interstitial nephritis, not specified as acute or chronic: Secondary | ICD-10-CM | POA: Diagnosis present

## 2021-01-31 DIAGNOSIS — I10 Essential (primary) hypertension: Secondary | ICD-10-CM | POA: Diagnosis present

## 2021-01-31 DIAGNOSIS — R509 Fever, unspecified: Secondary | ICD-10-CM | POA: Diagnosis not present

## 2021-01-31 DIAGNOSIS — M3214 Glomerular disease in systemic lupus erythematosus: Secondary | ICD-10-CM | POA: Diagnosis present

## 2021-01-31 DIAGNOSIS — K529 Noninfective gastroenteritis and colitis, unspecified: Secondary | ICD-10-CM | POA: Diagnosis present

## 2021-01-31 DIAGNOSIS — Z7952 Long term (current) use of systemic steroids: Secondary | ICD-10-CM | POA: Diagnosis not present

## 2021-01-31 DIAGNOSIS — M329 Systemic lupus erythematosus, unspecified: Secondary | ICD-10-CM | POA: Diagnosis not present

## 2021-01-31 DIAGNOSIS — A419 Sepsis, unspecified organism: Secondary | ICD-10-CM

## 2021-01-31 DIAGNOSIS — D849 Immunodeficiency, unspecified: Secondary | ICD-10-CM

## 2021-01-31 DIAGNOSIS — Z880 Allergy status to penicillin: Secondary | ICD-10-CM | POA: Diagnosis not present

## 2021-01-31 DIAGNOSIS — Z79899 Other long term (current) drug therapy: Secondary | ICD-10-CM | POA: Diagnosis not present

## 2021-01-31 DIAGNOSIS — Z20822 Contact with and (suspected) exposure to covid-19: Secondary | ICD-10-CM | POA: Diagnosis present

## 2021-01-31 DIAGNOSIS — D61818 Other pancytopenia: Secondary | ICD-10-CM | POA: Diagnosis present

## 2021-01-31 DIAGNOSIS — A084 Viral intestinal infection, unspecified: Secondary | ICD-10-CM | POA: Diagnosis present

## 2021-01-31 DIAGNOSIS — R6510 Systemic inflammatory response syndrome (SIRS) of non-infectious origin without acute organ dysfunction: Secondary | ICD-10-CM | POA: Diagnosis present

## 2021-01-31 DIAGNOSIS — E876 Hypokalemia: Secondary | ICD-10-CM | POA: Diagnosis present

## 2021-01-31 LAB — CBC
HCT: 34.2 % — ABNORMAL LOW (ref 36.0–46.0)
Hemoglobin: 11.5 g/dL — ABNORMAL LOW (ref 12.0–15.0)
MCH: 30.5 pg (ref 26.0–34.0)
MCHC: 33.6 g/dL (ref 30.0–36.0)
MCV: 90.7 fL (ref 80.0–100.0)
Platelets: 142 10*3/uL — ABNORMAL LOW (ref 150–400)
RBC: 3.77 MIL/uL — ABNORMAL LOW (ref 3.87–5.11)
RDW: 12.9 % (ref 11.5–15.5)
WBC: 3.6 10*3/uL — ABNORMAL LOW (ref 4.0–10.5)
nRBC: 0 % (ref 0.0–0.2)

## 2021-01-31 LAB — BASIC METABOLIC PANEL
Anion gap: 8 (ref 5–15)
BUN: 12 mg/dL (ref 6–20)
CO2: 23 mmol/L (ref 22–32)
Calcium: 8.8 mg/dL — ABNORMAL LOW (ref 8.9–10.3)
Chloride: 107 mmol/L (ref 98–111)
Creatinine, Ser: 0.67 mg/dL (ref 0.44–1.00)
GFR, Estimated: >60 mL/min (ref >60)
Glucose, Bld: 101 mg/dL — ABNORMAL HIGH (ref 70–99)
Potassium: 4.3 mmol/L (ref 3.5–5.1)
Sodium: 138 mmol/L (ref 135–145)

## 2021-01-31 LAB — HIV ANTIBODY (ROUTINE TESTING W REFLEX): HIV Screen 4th Generation wRfx: NONREACTIVE

## 2021-01-31 MED ORDER — POTASSIUM CHLORIDE 20 MEQ PO PACK
40.0000 meq | PACK | Freq: Once | ORAL | Status: AC
  Administered 2021-01-31: 40 meq via ORAL
  Filled 2021-01-31: qty 2

## 2021-01-31 MED ORDER — ONDANSETRON HCL 4 MG/2ML IJ SOLN: 4.0000 mg | Freq: Four times a day (QID) | INTRAMUSCULAR | Status: DC | PRN

## 2021-01-31 MED ORDER — ACETAMINOPHEN 325 MG PO TABS: 650.0000 mg | ORAL_TABLET | Freq: Four times a day (QID) | ORAL | Status: DC | PRN

## 2021-01-31 MED ORDER — ENOXAPARIN SODIUM 40 MG/0.4ML ~~LOC~~ SOLN
40.0000 mg | SUBCUTANEOUS | Status: DC
  Administered 2021-01-31 – 2021-02-01 (×2): 40 mg via SUBCUTANEOUS
  Filled 2021-01-31 (×2): qty 0.4

## 2021-01-31 MED ORDER — VANCOMYCIN HCL 1000 MG/200ML IV SOLN
1000.0000 mg | INTRAVENOUS | Status: DC
  Administered 2021-01-31: 1000 mg via INTRAVENOUS
  Filled 2021-01-31 (×2): qty 200

## 2021-01-31 MED ORDER — LISINOPRIL 10 MG PO TABS
10.0000 mg | ORAL_TABLET | Freq: Every day | ORAL | Status: DC
  Administered 2021-02-01: 10 mg via ORAL
  Filled 2021-01-31: qty 1

## 2021-01-31 MED ORDER — ACETAMINOPHEN 650 MG RE SUPP: 650.0000 mg | Freq: Four times a day (QID) | RECTAL | Status: DC | PRN

## 2021-01-31 MED ORDER — HYDROXYCHLOROQUINE SULFATE 200 MG PO TABS
300.0000 mg | ORAL_TABLET | Freq: Every day | ORAL | Status: DC
  Administered 2021-01-31 – 2021-02-01 (×2): 300 mg via ORAL
  Filled 2021-01-31 (×2): qty 1.5

## 2021-01-31 MED ORDER — SODIUM CHLORIDE 0.9 % IV SOLN
1.0000 g | INTRAVENOUS | Status: DC
  Administered 2021-01-31 – 2021-02-01 (×2): 1 g via INTRAVENOUS
  Filled 2021-01-31 (×2): qty 1

## 2021-01-31 MED ORDER — ONDANSETRON HCL 4 MG PO TABS: 4.0000 mg | ORAL_TABLET | Freq: Four times a day (QID) | ORAL | Status: DC | PRN

## 2021-01-31 MED ORDER — PREDNISONE 20 MG PO TABS
20.0000 mg | ORAL_TABLET | Freq: Every day | ORAL | Status: DC
  Administered 2021-01-31 – 2021-02-01 (×2): 20 mg via ORAL
  Filled 2021-01-31 (×2): qty 1

## 2021-01-31 NOTE — Consult Note (Signed)
[facility]    Date of Admission:  01/30/2021     Reason for Consult: sepsis, immunosuppression    Referring Provider: Hongalgi   CC: sepsis   Lines:  Peripheral iv's  Abx: 3/30-c vanc 3/30-c ceftriaxone  3/30 cefepime  Other: Prednisone currently at 20 mg since 12/26/20 taper hcq Tacrolimus azathioprine         Assessment: 25 yo female sle with cutaneous manifestation, hx lupus nephritis, chronic immunosuppression, recent flare 2/23 started on prednisone taper by rheum, admitted 3/30 for sepsis in setting 5 days myalgia, fatigue, subjective f/c   Short duration recent steroid burst but she does have other immunosuppression meds such as immuran chronically. She is still at high risk for regular viral/bacterial process. Opportunistic infection such as PJP along with higher risk endemic fungi, herpes virus considered, but would not investigate those without persistent fever/focal-radiologic abnormality  So far cxr clear and no particular focal syndrome  She doesn't have marked pancytopenia. Would repeat this along with UA and see if related to her lupus. Although atypical as she is on high dose prednisone now   She looks very nontoxic now, although that can be deceiving given her immunsuppression. However she does feel better than when sx onset 5 days ago.   I discussed with her to wait until at least tomorrow afternoon for blood cx to return and see what her fever/lft/cbc are doing before deciding to leave (she was threatening to leave [facility] now), and she agrees  Plan: 1. Continue vanc/ceftriaxone.  2. If blood culture remains negative by tomorrow, I would stop antibiotics 3. Check a full respiratory viral pcr (the swab sample from the ed can be used); I have placed order for the resp pcr 4. Consider sending repeat ua and ds dna, ana, smith antibody to monitor lupus progress  5. If persistent fever would consider chest/abd/pelv  ct 6. I have clinic in the morning tomorrow; if immediate need please page me. But will chart review for any immediate change. Will round on her in the afternoon if she is still around 7. She knows she needs to have urgent f/u with her pcp or rheumatogist within the next 1-2 weeks after discharge     ------------------------------------------------ Principal Problem:   SIRS (systemic inflammatory response syndrome) (HCC) Active Problems:   Lupus (HCC)   Hypertension    HPI: Darlene Ruiz is a 25 y.o. female hx lupus with recent flare on escalated dose prednisone, admitted for sepsis  She reported 5 days flu like illness including fever, chill, myalgia, and some diarrhea She denies cough, joint pain, back pain  She has no sick contact She denies headache, cough, sorethroat, chest pain, short of breath, n/v Her sx are improving   10/2020-11/2020 covid infection  I reviewed her rheumatologist note from 3/08: sle dx'ed 2012  Manifestation discoid lupus, lupus nephritis, cutaneous vasculitis Ana 1:2560, positive smith/ro/la/ds-dna, and  Hypocomplementemia at dx 12/11/20 lupus flare due to medication nonadherence due to covid and feeling poorly. Sx include facial/extremity rash, fatigue, joint pain hands-knees, and myositis Prednisone taper started 40 mg max since 12/26/2020 3/08 visit sx improving but rash persistent however less "inflammed." She was also started on IV solumedrol 3/28 besides the tapering po pred "High risk medication use 01/25/2013  Overview  a. Methotrexate initiated 10/2011  b. CellCept initiated 02/27/2012; changed to Myfortic 05/07/2012 - 08/10/2012 c. Revlimid initiated 08/10/2012, decreased to 5mg  every other day due to  neutropenia d. Benlysta initiated 01/2013, discontinued ~09/2013 e. Methotrexate switched from IV to PO 08/2013 due to GI intolerance. Continued severe prolonged nausea despite Zofran pretreatment, folic acid, and leucovorin.  f. Cytoxan started  07/2015 - received 1g monthly x7, then 1g Q60mo x2, then 1g Q38mo x2 (total of 11g Cytoxan) g. Tacrolimus started 12/2013, discontinued 07/2015 due to starting Cytoxan, but then restarted a few months later when vasculitic rash resurfaced; vasculitic rash responded well.  H. Rituximab and cytoxan gien 10/2017- improvement in complements and dsDNA but persistent skin disease I. Tacrolimus and imuran resumed Spring 2019."   Has a rash for which thought to be flare of lupus, started on high dose pred 3/23 Usually takes plaquenil Rash still there on face/trunk/extremities    Febrile 102's on admission, but stable normal hemodynamics and o2 sat cxr was without focal opacity Covid/flu pcr and strep throat pcr negative Urinalysis show 21-50 wbc, 0-5 rbc no evidence of acute renal disturbance of lft elevation Mild relative cytopenia ED providers discussed case with patient's rheumatologist who doesn't feel inpatient rheum eval is needed urgently Blood cx obtained and started on empiric vanc/ceftriaxone   Patient overall feels better, eating better, and really wants to go home   Family History  Problem Relation Age of Onset  . Hypertension Other     Social History   Tobacco Use  . Smoking status: Never Smoker  . Smokeless tobacco: Never Used  Substance Use Topics  . Alcohol use: No  . Drug use: No    Allergies  Allergen Reactions  . Amoxicillin Other (See Comments)    Review of Systems: ROS All Other ROS was negative, except mentioned above   Past Medical History:  Diagnosis Date  . Hypertension   . Lupus (HCC)   . Renal disorder        Scheduled Meds: . enoxaparin (LOVENOX) injection  40 mg Subcutaneous Q24H  . hydroxychloroquine  300 mg Oral Daily  . lisinopril  10 mg Oral Daily  . predniSONE  20 mg Oral Q breakfast   Continuous Infusions: . sodium chloride Stopped (01/30/21 1934)  . cefTRIAXone (ROCEPHIN)  IV 1 g (01/31/21 0120)   PRN Meds:.sodium chloride,  acetaminophen **OR** acetaminophen, ondansetron **OR** ondansetron (ZOFRAN) IV   OBJECTIVE: Blood pressure (!) 104/57, pulse 69, temperature 98.1 F (36.7 C), temperature source Oral, resp. rate 18, height 4\' 8"  (1.422 m), weight 50.8 kg, SpO2 96 %.  Physical Exam No distress, conversant Heent; normocephalic; per; conj clear; eomi; oropharynx clear Neck supple Cv: rrr no mrg Lungs clear; normal respiratory effort abd s/nt Ext no edema Skin diffuse plaque like skin tone rash on face/trunk/ext nontender and nonulcerating Neuro cn2-12 intact, strength 5/5, reflex normal Psych alert/oriented msk no peripheral joint swelling/tenderness; no myalgia    Lab Results Lab Results  Component Value Date   WBC 3.6 (L) 01/31/2021   HGB 11.5 (L) 01/31/2021   HCT 34.2 (L) 01/31/2021   MCV 90.7 01/31/2021   PLT 142 (L) 01/31/2021    Lab Results  Component Value Date   CREATININE 0.67 01/31/2021   BUN 12 01/31/2021   NA 138 01/31/2021   K 4.3 01/31/2021   CL 107 01/31/2021   CO2 23 01/31/2021    Lab Results  Component Value Date   ALT 21 01/30/2021   AST 31 01/30/2021   ALKPHOS 60 01/30/2021   BILITOT 0.7 01/30/2021      Microbiology: Recent Results (from the past 240 hour(s))  Culture, blood (x  2)     Status: None (Preliminary result)   Collection Time: 01/30/21  2:30 PM   Specimen: Left Antecubital; Blood  Result Value Ref Range Status   Specimen Description   Final    LEFT ANTECUBITAL Performed at [facility], [address] [place], [place] [address]    Special Requests   Final    BOTTLES DRAWN AEROBIC AND ANAEROBIC Blood Culture adequate volume Performed at [facility], [address] [place], [place] [address]    Culture   Final    NO GROWTH < 24 HOURS Performed at [facility], [address] [place], [place] [address]    Report Status PENDING  Incomplete  Culture, blood (x 2)     Status: None (Preliminary result)   Collection  Time: 01/30/21  2:47 PM   Specimen: Right Antecubital; Blood  Result Value Ref Range Status   Specimen Description   Final    RIGHT ANTECUBITAL Performed at [facility], [address] [place], [place] [address]    Special Requests   Final    BOTTLES DRAWN AEROBIC AND ANAEROBIC Blood Culture adequate volume Performed at [facility], [address] [place], [place] [address]    Culture   Final    NO GROWTH < 24 HOURS Performed at [facility], [address] [place], [place] [address]    Report Status PENDING  Incomplete  Group A Strep by PCR     Status: None   Collection Time: 01/30/21  2:54 PM   Specimen: Throat; Sterile Swab  Result Value Ref Range Status   Group A Strep by PCR NOT DETECTED NOT DETECTED Final    Comment: Performed at [facility], [address] [place], [place] [address]  Resp Panel by RT-PCR (Flu A&B, Covid) Nasopharyngeal Swab     Status: None   Collection Time: 01/30/21  2:54 PM   Specimen: Nasopharyngeal Swab; Nasopharyngeal(NP) swabs in vial transport medium  Result Value Ref Range Status   SARS Coronavirus 2 by RT PCR NEGATIVE NEGATIVE Final    Comment: (NOTE) SARS-CoV-2 target nucleic acids are NOT DETECTED.  The SARS-CoV-2 RNA is generally detectable in upper respiratory specimens during the acute phase of infection. The lowest concentration of SARS-CoV-2 viral copies this assay can detect is 138 copies/mL. A negative result does not preclude SARS-Cov-2 infection and should not be used as the sole basis for treatment or other patient management decisions. A negative result may occur with  improper specimen collection/handling, submission of specimen other than nasopharyngeal swab, presence of viral mutation(s) within the areas targeted by this assay, and inadequate number of viral copies(<138 copies/mL). A negative result must be combined with clinical observations, patient history, and  epidemiological information. The expected result is Negative.  Fact Sheet for Patients:  BloggerCourse.com  Fact Sheet for Healthcare Providers:  SeriousBroker.it  This test is no t yet approved or cleared by the [place] FDA and  has been authorized for detection and/or diagnosis of SARS-CoV-2 by FDA under an Emergency Use Authorization (EUA). This EUA will remain  in effect (meaning this test can be used) for the duration of the COVID-19 declaration under Section 564(b)(1) of the Act, 21 U.S.C.section 360bbb-3(b)(1), unless the authorization is terminated  or revoked sooner.       Influenza A by PCR NEGATIVE NEGATIVE Final   Influenza B by PCR NEGATIVE NEGATIVE Final  Comment: (NOTE) The Xpert Xpress SARS-CoV-2/FLU/RSV plus assay is intended as an aid in the diagnosis of influenza from Nasopharyngeal swab specimens and should not be used as a sole basis for treatment. Nasal washings and aspirates are unacceptable for Xpert Xpress SARS-CoV-2/FLU/RSV testing.  Fact Sheet for Patients: BloggerCourse.com  Fact Sheet for Healthcare Providers: SeriousBroker.it  This test is not yet approved or cleared by the [place] FDA and has been authorized for detection and/or diagnosis of SARS-CoV-2 by FDA under an Emergency Use Authorization (EUA). This EUA will remain in effect (meaning this test can be used) for the duration of the COVID-19 declaration under Section 564(b)(1) of the Act, 21 U.S.C. section 360bbb-3(b)(1), unless the authorization is terminated or revoked.  Performed at [facility], [address] [place], [place] [address]      Serology: 3/30 throat strep group a pcr negative 3/30 covid/flu pcr negative 3/31 hiv screen nonreactive  Micro: 3/30 blood cx in progress 3/30 ucx in progress  Imaging: If present, new imagings (plain films,  ct scans, and mri) have been personally visualized and interpreted; radiology reports have been reviewed. Decision making incorporated into the Impression / Recommendations.  3/30 cxr no acute abnormality  Raymondo Band, MD [facility] for Infectious [facility] [phone] pager    01/31/2021, 12:12 PM

## 2021-01-31 NOTE — Progress Notes (Addendum)
PROGRESS NOTE   Darlene Ruiz  ZOX:[id number]    DOB: 10/27/1996    DOA: 01/30/2021  PCP: Irena Reichmann, DO   I have briefly reviewed patients previous medical records in [facility].  Chief Complaint  Patient presents with  . Fatigue    Brief Narrative:  25 year old female with medical history significant for but not limited to SLE with cutaneous manifestations, lupus nephritis, hypertension, presented to the ED with symptoms of chills, fatigue and body aches of 5 days duration.  She has had a recent lupus flare for which she is receiving weekly IV Solu-Medrol through her rheumatologist at [facility] and has completed 1 dose thus far.  Admitted for evaluation and management of SIRS/febrile illness of unclear source.  Infectious disease consulted.   Assessment & Plan:  Principal Problem:   SIRS (systemic inflammatory response syndrome) (HCC) Active Problems:   Lupus (HCC)   Hypertension   Immunosuppressed status (HCC)   SIRS/febrile illness  Presented with fever as high as 102.7 F, tachycardia, body aches, fatigue, chills and loose stools.  No obvious infectious source based on signs and symptoms.  No sick contacts, insect bites or recent travel.  Chronically immunosuppressed from lupus meds.  Urine microscopy is abnormal but patient lacks UTI symptoms, does not rule out UTI.  COVID-19 and flu panel PCR negative.  Chest x-ray without pneumonia.  Viral etiology possible including gastroenteritis.  As per admitting MDs/EDP discussion with her primary rheumatologist, continuing prior home dose of prednisone but holding azathioprine and tacrolimus.  ID consulted and input appreciated.  Discussed with Dr. Rutha Bouchard.  Indicates that patient is at high risk for viral/bacterial processes and opportunistic infections.  He recommends continuing vancomycin, ceftriaxone, follow-up blood cultures and if remains negative by tomorrow, recommend stopping antibiotics.  He has requested full  viral PCR.  Also if has persistent fever then recommends considering chest/abdomen/pelvis CT  Patient was threatening to leave AMA prior to ID visit but after meeting with ID, agreeable to stay overnight.  Clinically better.  Defervesced.  Mild pancytopenia.  Follow repeat CBC with differential in a.m.  Urine culture pending.  Blood cultures x2: Negative to date.  I discussed in detail with patient's rheumatologist at [facility] Dr. Carmela Hurt who feels that this is a UTI and recommends that at discharge she should be discharged on antibiotics to cover for the same and should not be discharged without antibiotics.  She also does not think that this is a lupus flare and does not recommend rechecking dsDNA, ANA, Smith antibody which were recently done 3 weeks ago as outpatient.  Pancytopenia  Unclear etiology.?  Related to underlying fever and infectious etiology versus others.  Follow CBC with differential in AM.  Hypokalemia  Replaced.  SLE with vasculitic rash  Follows with [facility] rheumatology, Dr. Nelva Nay, who recommended holding azathioprine and tacrolimus until afebrile 24 hours and 48 hours removed from initial IV antibiotic dose. Continue prednisone and Plaquenil.  Essential hypertension  Continue lisinopril  Body mass index is 25.11 kg/m.    DVT prophylaxis: enoxaparin (LOVENOX) injection 40 mg Start: 01/31/21 1000     Code Status: Full Code Family Communication: None at bedside. Disposition:  Status is: Observation  The patient will require care spanning > 2 midnights and should be moved to inpatient because: Inpatient level of care appropriate due to severity of illness  Dispo: The patient is from: Home              Anticipated d/c is  to: Home              Patient currently is not medically stable to d/c.   Difficult to place patient No        Consultants:   Infectious disease  Procedures:   None  Antimicrobials:    Anti-infectives (From admission, onward)    Start     Dose/Rate Route Frequency Ordered Stop   01/31/21 1000  hydroxychloroquine (PLAQUENIL) tablet 300 mg        300 mg Oral Daily 01/31/21 0055     01/31/21 0130  cefTRIAXone (ROCEPHIN) 1 g in sodium chloride 0.9 % 100 mL IVPB        1 g 200 mL/hr over 30 Minutes Intravenous Every 24 hours 01/31/21 0056     01/30/21 1500  aztreonam (AZACTAM) 2 g in sodium chloride 0.9 % 100 mL IVPB  Status:  Discontinued        2 g 200 mL/hr over 30 Minutes Intravenous  Once 01/30/21 1450 01/30/21 1454   01/30/21 1500  vancomycin (VANCOCIN) IVPB 1000 mg/200 mL premix        1,000 mg 200 mL/hr over 60 Minutes Intravenous  Once 01/30/21 1450 01/30/21 1744   01/30/21 1500  ceFEPIme (MAXIPIME) 2 g in sodium chloride 0.9 % 100 mL IVPB        2 g 200 mL/hr over 30 Minutes Intravenous  Once 01/30/21 1454 01/30/21 1603        Subjective:  Seen early this morning.  Reported feeling better than when she came into the ED.  Appetite improved.  Body aches and chills better.  Able to ambulate in the room without difficulty.  Denies dysuria or urinary frequency.  No sick contacts.  No insect bites.  Reports that she does not have menstrual period's due to being on OCPs.  Indicates that she has an upcoming appointment in the next day or 2 for her second dose of IV steroids.  No asymmetric leg swelling or pain.  Objective:   Vitals:   01/30/21 2356 01/31/21 0135 01/31/21 0624 01/31/21 1344  BP: 115/73 (!) 112/91 (!) 104/57 118/88  Pulse: 62 (!) 57 69 65  Resp: 18 18 18 18   Temp: 98.2 F (36.8 C) 98.3 F (36.8 C) 98.1 F (36.7 C) 98.6 F (37 C)  TempSrc: Oral Oral Oral Oral  SpO2: 100% 100% 96% 100%  Weight:      Height:        General exam: Young female, moderately built and nourished lying comfortably propped up in bed without distress.  Did not look septic or toxic. Respiratory system: Clear to auscultation. Respiratory effort normal. Cardiovascular system: S1 & S2 heard, RRR. No JVD, murmurs,  rubs, gallops or clicks. No pedal edema. Gastrointestinal system: Abdomen is nondistended, soft and nontender. No organomegaly or masses felt. Normal bowel sounds heard. Central nervous system: Alert and oriented. No focal neurological deficits. Extremities: Symmetric 5 x 5 power. Skin: Rash on face, exposed upper extremities-no acute findings and she indicates that these are unchanged from prior Psychiatry: Judgement and insight appear normal. Mood & affect appropriate.     Data Reviewed:   I have personally reviewed following labs and imaging studies   CBC: Recent Labs  Lab 01/30/21 1430 01/31/21 0427  WBC 6.0 3.6*  NEUTROABS 5.3  --   HGB 13.2 11.5*  HCT 37.6 34.2*  MCV 86.6 90.7  PLT 153 142*    Basic Metabolic Panel: Recent Labs  Lab 01/30/21  1430 01/31/21 0427  NA 131* 138  K 3.4* 4.3  CL 100 107  CO2 20* 23  GLUCOSE 104* 101*  BUN 14 12  CREATININE 0.93 0.67  CALCIUM 8.7* 8.8*    Liver Function Tests: Recent Labs  Lab 01/30/21 1430  AST 31  ALT 21  ALKPHOS 60  BILITOT 0.7  PROT 7.1  ALBUMIN 4.1    CBG: No results for input(s): GLUCAP in the last 168 hours.  Microbiology Studies:   Recent Results (from the past 240 hour(s))  Culture, blood (x 2)     Status: None (Preliminary result)   Collection Time: 01/30/21  2:30 PM   Specimen: Left Antecubital; Blood  Result Value Ref Range Status   Specimen Description   Final    LEFT ANTECUBITAL Performed at [facility], [address] [place], [place] [address]    Special Requests   Final    BOTTLES DRAWN AEROBIC AND ANAEROBIC Blood Culture adequate volume Performed at [facility], [address] [place], [place] [address]    Culture   Final    NO GROWTH < 24 HOURS Performed at [facility], [address], [place], [place] [address]    Report Status PENDING  Incomplete  Culture, blood (x 2)     Status: None (Preliminary result)   Collection Time: 01/30/21  2:47  PM   Specimen: Right Antecubital; Blood  Result Value Ref Range Status   Specimen Description   Final    RIGHT ANTECUBITAL Performed at [facility], [address] [place], [place] [address]    Special Requests   Final    BOTTLES DRAWN AEROBIC AND ANAEROBIC Blood Culture adequate volume Performed at [facility], [address] [place], [place] [address]    Culture   Final    NO GROWTH < 24 HOURS Performed at [facility], [address] [place], [place] [address]    Report Status PENDING  Incomplete  Group A Strep by PCR     Status: None   Collection Time: 01/30/21  2:54 PM   Specimen: Throat; Sterile Swab  Result Value Ref Range Status   Group A Strep by PCR NOT DETECTED NOT DETECTED Final    Comment: Performed at [facility], [address] [place], [place] [address]  Resp Panel by RT-PCR (Flu A&B, Covid) Nasopharyngeal Swab     Status: None   Collection Time: 01/30/21  2:54 PM   Specimen: Nasopharyngeal Swab; Nasopharyngeal(NP) swabs in vial transport medium  Result Value Ref Range Status   SARS Coronavirus 2 by RT PCR NEGATIVE NEGATIVE Final    Comment: (NOTE) SARS-CoV-2 target nucleic acids are NOT DETECTED.  The SARS-CoV-2 RNA is generally detectable in upper respiratory specimens during the acute phase of infection. The lowest concentration of SARS-CoV-2 viral copies this assay can detect is 138 copies/mL. A negative result does not preclude SARS-Cov-2 infection and should not be used as the sole basis for treatment or other patient management decisions. A negative result may occur with  improper specimen collection/handling, submission of specimen other than nasopharyngeal swab, presence of viral mutation(s) within the areas targeted by this assay, and inadequate number of viral copies(<138 copies/mL). A negative result must be combined with clinical observations, patient history, and epidemiological information. The  expected result is Negative.  Fact Sheet for Patients:  BloggerCourse.com  Fact Sheet for Healthcare Providers:  SeriousBroker.ithttps://www.fda.gov/media/152162/download  This test is no t yet approved or cleared by the [place] FDA and  has been authorized for detection and/or diagnosis of SARS-CoV-2 by FDA under an Emergency Use Authorization (EUA). This EUA will remain  in effect (meaning this test can be used) for the duration of the COVID-19 declaration under Section 564(b)(1) of the Act, 21 U.S.C.section 360bbb-3(b)(1), unless the authorization is terminated  or revoked sooner.       Influenza A by PCR NEGATIVE NEGATIVE Final   Influenza B by PCR NEGATIVE NEGATIVE Final    Comment: (NOTE) The Xpert Xpress SARS-CoV-2/FLU/RSV plus assay is intended as an aid in the diagnosis of influenza from Nasopharyngeal swab specimens and should not be used as a sole basis for treatment. Nasal washings and aspirates are unacceptable for Xpert Xpress SARS-CoV-2/FLU/RSV testing.  Fact Sheet for Patients: BloggerCourse.comhttps://www.fda.gov/media/152166/download  Fact Sheet for Healthcare Providers: SeriousBroker.ithttps://www.fda.gov/media/152162/download  This test is not yet approved or cleared by the [place] FDA and has been authorized for detection and/or diagnosis of SARS-CoV-2 by FDA under an Emergency Use Authorization (EUA). This EUA will remain in effect (meaning this test can be used) for the duration of the COVID-19 declaration under Section 564(b)(1) of the Act, 21 U.S.C. section 360bbb-3(b)(1), unless the authorization is terminated or revoked.  Performed at [facility] [place], [address] [place], [place] [address]      Radiology Studies:  DG Chest [place] View  Result Date: 01/30/2021 CLINICAL DATA:  Fever EXAM: PORTABLE CHEST 1 VIEW COMPARISON:  10/07/2017 FINDINGS: No consolidation, features of edema, pneumothorax, or effusion. Pulmonary vascularity is normally  distributed. The cardiomediastinal contours are unremarkable. Metallic bilateral nipple ornamentation is noted. Corticated mineralization seen adjacent the left coracoid, possibly developmental given appearance on multiple prior exams. No other acute osseous or soft tissue abnormality. IMPRESSION: No acute cardiopulmonary abnormality. Electronically Signed   By: Kreg ShropshirePrice  DeHay M.D.   On: 01/30/2021 16:19     Scheduled Meds:   . enoxaparin (LOVENOX) injection  40 mg Subcutaneous Q24H  . hydroxychloroquine  300 mg Oral Daily  . lisinopril  10 mg Oral Daily  . predniSONE  20 mg Oral Q breakfast    Continuous Infusions:   . sodium chloride Stopped (01/30/21 1934)  . cefTRIAXone (ROCEPHIN)  IV 1 g (01/31/21 0120)     LOS: 0 days     Marcellus ScottAnand Alsie Younes, MD, EversonFACP, [facility]. [facility]    To contact the attending provider between 7A-7P or the covering provider during after hours 7P-7A, please log into the web site [email or web address] and access using universal Mildred password for that web site. If you do not have the password, please call the hospital operator.  01/31/2021, 4:02 PM

## 2021-01-31 NOTE — H&P (Signed)
History and Physical    Darlene Ruiz MHD:[id number] DOB: 07-10-1996 DOA: 01/30/2021  PCP: Irena Reichmann, DO  Patient coming from: [facility] ED  I have personally briefly reviewed patient's old medical records in [facility]  Chief Complaint: Body aches, fatigue, chills  HPI: Darlene Ruiz is a 25 y.o. female with medical history significant for systemic lupus erythematosus with cutaneous manifestations, lupus nephritis, hypertension who presented to the ED for evaluation of chills, fatigue, body aches.  Patient reported 5 days of significant fatigue, body aches, chills, cramping abdominal pain, nausea with scant emesis, poor oral intake, and loose stools.  She had 1 night of diaphoresis.  She has not had any chest pain, dyspnea, cough, dysuria, or swelling of her extremities.  She has had a recent lupus flare with worsening rash and received IV Solu-Medrol 500 mg on 01/23/21 per her rheumatologist.  She says her rash initially improved but now appears similar to what it was like before she received IV Solu-Medrol.  [facility] ED Course:  Initial vitals showed BP 138/92, pulse 105, RR 20, temp 102.7 F, SPO2 100% on room air.  Labs are significant for sodium 131, potassium 3.4, bicarb 20, BUN 14, creatinine 0.93, serum glucose 104, LFTs within normal limits, WBC 6.0, hemoglobin 13.2, platelets 153,000, lactic acid 1.0, group A strep PCR negative.  SARS-CoV-2 PCR is negative.  Influenza A/B PCR is negative.  Urinalysis showed negative nitrites, moderate leukocytes, 0-5 RBC/hpf, 21-50 WBC/hpf, many bacteria on microscopy.  Blood and urine cultures obtained and pending.  Portable chest x-ray negative for focal consolidation, edema, or effusion.  Patient was given IV vancomycin and cefepime.  EDP discussed with patient's rheumatologist, Dr. Everlean Patterson, who felt inpatient rheumatology services not currently indicated.  Recommendation was to continue home prednisone  and to hold azathioprine and tacrolimus for 24 hours post fever and 48 hours post first antibiotic dose.  The hospitalist service was consulted to admit for further evaluation and management.  Review of Systems: All systems reviewed and are negative except as documented in history of present illness above.   Past Medical History:  Diagnosis Date  . Hypertension   . Lupus (HCC)   . Renal disorder     Past Surgical History:  Procedure Laterality Date  . RENAL BIOPSY      Social History:  reports that she has never smoked. She has never used smokeless tobacco. She reports that she does not drink alcohol and does not use drugs.  Allergies  Allergen Reactions  . Amoxicillin Other (See Comments)    Family History  Problem Relation Age of Onset  . Hypertension Other      Prior to Admission medications   Medication Sig Start Date End Date Taking? Authorizing Provider  azaTHIOprine (IMURAN) 50 MG tablet Take 100 mg by mouth daily. 11/26/16  Yes [provider]  clindamycin (CLEOCIN T) 1 % lotion Apply 1 application topically daily.   Yes [provider]  desonide (DESOWEN) 0.05 % ointment Apply 1 application topically daily.   Yes [provider]  ENVARSUS XR 4 MG TB24 Take 1 tablet by mouth daily. 10/08/20  Yes [provider]  fluocinonide ointment (LIDEX) 0.05 % Apply 1 application topically See admin instructions. Apply 2 times a day to affected areas of lupus. Strict sun protection.  Stop when smooth. 03/25/19  Yes [provider]  hydroxychloroquine (PLAQUENIL) 200 MG tablet Take 300 mg by mouth daily.   Yes  [provider]  lisinopril (PRINIVIL,ZESTRIL) 10 MG tablet Take 10 mg by mouth daily.   Yes [provider]  loratadine (CLARITIN) 10 MG tablet Take 10 mg by mouth daily as needed. allergies   Yes [provider]  medroxyPROGESTERone (DEPO-PROVERA) 150 MG/ML injection Inject 150 mg into the muscle every 3  (three) months. 11/08/20  Yes [provider]  montelukast (SINGULAIR) 10 MG tablet Take 10 mg by mouth daily as needed. allergies   Yes [provider]  predniSONE (DELTASONE) 10 MG tablet Take 10 mg by mouth daily. 11/06/20  Yes [provider]  valACYclovir (VALTREX) 1000 MG tablet Take 1,000 mg by mouth daily as needed (OB).   Yes [provider]    Physical Exam: Vitals:   01/30/21 1826 01/30/21 1930 01/30/21 2128 01/30/21 2356  BP: 116/68 113/69 112/63 115/73  Pulse: 76 72 72 62  Resp: 16 16 16 18   Temp: 99.7 F (37.6 C)  98.3 F (36.8 C) 98.2 F (36.8 C)  TempSrc: Oral  Oral Oral  SpO2: 100% 100% 100% 100%  Weight:      Height:       Constitutional: Resting supine in bed, NAD, calm, comfortable Eyes: PERRL, lids and conjunctivae normal ENMT: Mucous membranes are moist. Posterior pharynx clear of any exudate or lesions.Normal dentition.  Neck: normal, supple, no masses. Respiratory: clear to auscultation bilaterally, no wheezing, no crackles. Normal respiratory effort. No accessory muscle use.  Cardiovascular: Regular rate and rhythm, no murmurs / rubs / gallops. No extremity edema. 2+ pedal pulses. Abdomen: no tenderness, no masses palpated. No hepatosplenomegaly. Bowel sounds positive.  Musculoskeletal: no clubbing / cyanosis. No joint deformity upper and lower extremities. Good ROM, no contractures. Normal muscle tone.  Skin: Hyperpigmented vasculitic rash on forehead and cheeks, no open wound or drainage. No induration Neurologic: CN 2-12 grossly intact. Sensation intact. Strength 5/5 in all 4.  Psychiatric: Normal judgment and insight. Alert and oriented x 3. Normal mood.   Labs on Admission: I have personally reviewed following labs and imaging studies  CBC: Recent Labs  Lab 01/30/21 1430  WBC 6.0  NEUTROABS 5.3  HGB 13.2  HCT 37.6  MCV 86.6  PLT 153   Basic Metabolic Panel: Recent Labs  Lab 01/30/21 1430  NA 131*  K 3.4*   CL 100  CO2 20*  GLUCOSE 104*  BUN 14  CREATININE 0.93  CALCIUM 8.7*   GFR: Estimated Creatinine Clearance: 62.5 mL/min (by C-G formula based on SCr of 0.93 mg/dL). Liver Function Tests: Recent Labs  Lab 01/30/21 1430  AST 31  ALT 21  ALKPHOS 60  BILITOT 0.7  PROT 7.1  ALBUMIN 4.1   No results for input(s): LIPASE, AMYLASE in the last 168 hours. No results for input(s): AMMONIA in the last 168 hours. Coagulation Profile: Recent Labs  Lab 01/30/21 1430  INR 1.1   Cardiac Enzymes: No results for input(s): CKTOTAL, CKMB, CKMBINDEX, TROPONINI in the last 168 hours. BNP (last 3 results) No results for input(s): PROBNP in the last 8760 hours. HbA1C: No results for input(s): HGBA1C in the last 72 hours. CBG: No results for input(s): GLUCAP in the last 168 hours. Lipid Profile: No results for input(s): CHOL, HDL, LDLCALC, TRIG, CHOLHDL, LDLDIRECT in the last 72 hours. Thyroid Function Tests: No results for input(s): TSH, T4TOTAL, FREET4, T3FREE, THYROIDAB in the last 72 hours. Anemia Panel: No results for input(s): VITAMINB12, FOLATE, FERRITIN, TIBC, IRON, RETICCTPCT in the last 72 hours. Urine analysis:  Component Value Date/Time   COLORURINE YELLOW 01/30/2021 1637   APPEARANCEUR CLEAR 01/30/2021 1637   LABSPEC 1.010 01/30/2021 1637   PHURINE 7.0 01/30/2021 1637   GLUCOSEU NEGATIVE 01/30/2021 1637   HGBUR NEGATIVE 01/30/2021 1637   BILIRUBINUR NEGATIVE 01/30/2021 1637   KETONESUR 15 (A) 01/30/2021 1637   PROTEINUR 30 (A) 01/30/2021 1637   UROBILINOGEN 0.2 04/25/2018 1653   NITRITE NEGATIVE 01/30/2021 1637   LEUKOCYTESUR MODERATE (A) 01/30/2021 1637    Radiological Exams on Admission: DG Chest Port 1 View  Result Date: 01/30/2021 CLINICAL DATA:  Fever EXAM: PORTABLE CHEST 1 VIEW COMPARISON:  10/07/2017 FINDINGS: No consolidation, features of edema, pneumothorax, or effusion. Pulmonary vascularity is normally distributed. The cardiomediastinal contours are  unremarkable. Metallic bilateral nipple ornamentation is noted. Corticated mineralization seen adjacent the left coracoid, possibly developmental given appearance on multiple prior exams. No other acute osseous or soft tissue abnormality. IMPRESSION: No acute cardiopulmonary abnormality. Electronically Signed   By: Kreg Shropshire M.D.   On: 01/30/2021 16:19    EKG: Not performed.  Assessment/Plan Principal Problem:   SIRS (systemic inflammatory response syndrome) (HCC) Active Problems:   Lupus (HCC)   Hypertension   Darlene Ruiz is a 25 y.o. female with medical history significant for systemic lupus erythematosus with cutaneous manifestations, lupus nephritis, hypertension who is admitted with SIRS/febrile illness.  SIRS/febrile illness: Presenting with fever, tachycardia, body aches, fatigue, chills, loose stools.  No obvious infectious source.  Patient has been on immunosuppression for lupus.  Urinalysis abnormal but patient denies any urinary symptoms.  Covid and flu tests are negative.  CXR without pneumonia.  Suspect viral illness/gastroenteritis versus UTI. -Continue empiric IV ceftriaxone -Holding azathioprine and tacrolimus -Follow blood and urine cultures  SLE with vasculitic rash: Follows with [facility] rheumatology, Dr. Everlean Patterson, who recommended holding azathioprine and tacrolimus until afebrile 24 hours and 48 hours removed from initial IV antibiotic dose.  Continue prednisone and Plaquenil.  Hypertension: Continue lisinopril.  DVT prophylaxis: Lovenox Code Status: Full code, confirmed with patient Family Communication: Discussed with patient, she has discussed with family Disposition Plan: From home and likely discharge to home in 1 day Consults called: None Level of care: Med-Surg Admission status:  Status is: Observation  The patient remains OBS appropriate and will d/c before 2 midnights.  Dispo: The patient is from: Home              Anticipated d/c is to: Home               Patient currently is not medically stable to d/c.   Difficult to place patient No   Darreld Mclean MD [facility]  If 7PM-7AM, please contact night-coverage [email or web address]  01/31/2021, 1:08 AM

## 2021-01-31 NOTE — Progress Notes (Signed)
Consult received. Arrived to patient room. Patient getting ready to get in shower. No meds due until 0130. Notified messaged nurse, Hilliard Clark RN re-consult when patient ready for PIV placement. Tomasita Morrow, RN VAST

## 2021-01-31 NOTE — Progress Notes (Signed)
Patient reached to RN to notify that she has spoke with her PCP, Dr. Nelva Nay, that has asked for our team to reach out to them to discuss treatment. MD made aware of this. PCP # is [phone] per patient.

## 2021-01-31 NOTE — Progress Notes (Signed)
Pharmacy Antibiotic Note  Darlene Ruiz is a 25 y.o. female admitted on 01/30/2021 with SIRS, febrile illness.  Pharmacy has been consulted for vancomycin dosing.  Antibiotics started in ED on 3/30.   Last dose of vancomycin 1 g IV was 3/30 1606.  Plan: Vancomycin 1000 mg IV every 24 hours Monitor clinical picture, renal function, vancomycin levels if indicated F/U C&S, abx deescalation / LOT   Height: 4\' 8"  (142.2 cm) Weight: 50.8 kg (112 lb) IBW/kg (Calculated) : 36.3  Temp (24hrs), Avg:98.5 F (36.9 C), Min:98.1 F (36.7 C), Max:99.7 F (37.6 C)  Recent Labs  Lab 01/30/21 1430 01/30/21 1449 01/31/21 0427  WBC 6.0  --  3.6*  CREATININE 0.93  --  0.67  LATICACIDVEN  --  1.0  --     Estimated Creatinine Clearance: 72.7 mL/min (by C-G formula based on SCr of 0.67 mg/dL).    Allergies  Allergen Reactions  . Amoxicillin Other (See Comments)    Antimicrobials in ED and this admission: 3/30 cefepime 2 g IV x 1 3/30 1606 vancomycin 1 g IV x 1 3/31 ceftriaxone >> 3/31 vancomycin>>   Microbiology results: 3/30 BCx: pending 3/30 UCx: sent 3/30 Group A strep: not detected 3/30 Covid: negative  Thank you for allowing pharmacy to be a part of this patient's care.  4/30, PharmD, BCPS 01/31/2021 4:46 PM

## 2021-02-01 DIAGNOSIS — D849 Immunodeficiency, unspecified: Secondary | ICD-10-CM | POA: Diagnosis not present

## 2021-02-01 DIAGNOSIS — R6510 Systemic inflammatory response syndrome (SIRS) of non-infectious origin without acute organ dysfunction: Secondary | ICD-10-CM

## 2021-02-01 DIAGNOSIS — R509 Fever, unspecified: Secondary | ICD-10-CM | POA: Diagnosis not present

## 2021-02-01 DIAGNOSIS — M329 Systemic lupus erythematosus, unspecified: Secondary | ICD-10-CM

## 2021-02-01 DIAGNOSIS — I10 Essential (primary) hypertension: Secondary | ICD-10-CM

## 2021-02-01 LAB — BASIC METABOLIC PANEL
Anion gap: 12 (ref 5–15)
BUN: 12 mg/dL (ref 6–20)
CO2: 18 mmol/L — ABNORMAL LOW (ref 22–32)
Calcium: 8.7 mg/dL — ABNORMAL LOW (ref 8.9–10.3)
Chloride: 108 mmol/L (ref 98–111)
Creatinine, Ser: 0.79 mg/dL (ref 0.44–1.00)
GFR, Estimated: >60 mL/min (ref >60)
Glucose, Bld: 79 mg/dL (ref 70–99)
Potassium: 3.3 mmol/L — ABNORMAL LOW (ref 3.5–5.1)
Sodium: 138 mmol/L (ref 135–145)

## 2021-02-01 LAB — RESPIRATORY PANEL BY PCR

## 2021-02-01 LAB — CBC WITH DIFFERENTIAL/PLATELET
Abs Immature Granulocytes: 0.03 10*3/uL (ref 0.00–0.07)
Basophils Absolute: 0 10*3/uL (ref 0.0–0.1)
Basophils Relative: 0 %
Eosinophils Absolute: 0 10*3/uL (ref 0.0–0.5)
Eosinophils Relative: 0 %
HCT: 35.3 % — ABNORMAL LOW (ref 36.0–46.0)
Hemoglobin: 11.5 g/dL — ABNORMAL LOW (ref 12.0–15.0)
Immature Granulocytes: 1 %
Lymphocytes Relative: 30 %
Lymphs Abs: 1.5 10*3/uL (ref 0.7–4.0)
MCH: 30.1 pg (ref 26.0–34.0)
MCHC: 32.6 g/dL (ref 30.0–36.0)
MCV: 92.4 fL (ref 80.0–100.0)
Monocytes Absolute: 0.4 10*3/uL (ref 0.1–1.0)
Monocytes Relative: 9 %
Neutro Abs: 3.2 10*3/uL (ref 1.7–7.7)
Neutrophils Relative %: 60 %
Platelets: 138 10*3/uL — ABNORMAL LOW (ref 150–400)
RBC: 3.82 MIL/uL — ABNORMAL LOW (ref 3.87–5.11)
RDW: 13 % (ref 11.5–15.5)
WBC: 5.2 10*3/uL (ref 4.0–10.5)
nRBC: 0 % (ref 0.0–0.2)

## 2021-02-01 LAB — MAGNESIUM: Magnesium: 2 mg/dL (ref 1.7–2.4)

## 2021-02-01 LAB — URINE CULTURE: Culture: NO GROWTH

## 2021-02-01 LAB — C-REACTIVE PROTEIN: CRP: 1 mg/dL — ABNORMAL HIGH (ref <1.0)

## 2021-02-01 MED ORDER — PREDNISONE 20 MG PO TABS: 20.0000 mg | ORAL_TABLET | Freq: Every day | ORAL | 0 refills | Status: DC

## 2021-02-01 MED ORDER — CEPHALEXIN 500 MG PO CAPS: 500.0000 mg | ORAL_CAPSULE | Freq: Three times a day (TID) | ORAL | 0 refills | Status: DC

## 2021-02-01 MED ORDER — ONDANSETRON HCL 4 MG PO TABS: 4.0000 mg | ORAL_TABLET | Freq: Four times a day (QID) | ORAL | 0 refills | Status: DC | PRN

## 2021-02-01 MED ORDER — POTASSIUM CHLORIDE CRYS ER 20 MEQ PO TBCR
40.0000 meq | EXTENDED_RELEASE_TABLET | Freq: Two times a day (BID) | ORAL | Status: DC
  Administered 2021-02-01: 40 meq via ORAL
  Filled 2021-02-01: qty 2

## 2021-02-01 MED ORDER — CEPHALEXIN 500 MG PO CAPS: 500.0000 mg | ORAL_CAPSULE | Freq: Three times a day (TID) | ORAL | Status: DC

## 2021-02-01 NOTE — Progress Notes (Incomplete)
PROGRESS NOTE    Darlene Ruiz  SFK:[id number] DOB: 1996-04-15 DOA: 01/30/2021 PCP: Irena Reichmann, DO     Brief Narrative:  [name], lupus nephritis, hypertension,   Presented to the ED with symptoms of chills, fatigue and body aches of 5 days duration.  She has had a recent lupus flare for which she is receiving weekly IV Solu-Medrol through her rheumatologist at [facility] and has completed 1 dose thus far.  Admitted for evaluation and management of SIRS/febrile illness of unclear source.  Infectious disease consulted.   Subjective: Afebrile overnight   Assessment & Plan: Covid vaccination;   Principal Problem:   SIRS (systemic inflammatory response syndrome) (HCC) Active Problems:   Lupus (HCC)   Hypertension   Immunosuppressed status (HCC)   Fever   SIRS/febrile illness  Presented with fever as high as 102.7 F, tachycardia, body aches, fatigue, chills and loose stools.  No obvious infectious source based on signs and symptoms.  No sick contacts, insect bites or recent travel.  Chronically immunosuppressed from lupus meds.  Urine microscopy is abnormal but patient lacks UTI symptoms, does not rule out UTI.  COVID-19 and flu panel PCR negative.  Chest x-ray without pneumonia.  Viral etiology possible including gastroenteritis.  As per admitting MDs/EDP discussion with her primary rheumatologist, continuing prior home dose of prednisone but holding azathioprine and tacrolimus.  ID consulted and input appreciated.  Discussed with Dr. Rutha Bouchard.  Indicates that patient is at high risk for viral/bacterial processes and opportunistic infections.  He recommends continuing vancomycin, ceftriaxone, follow-up blood cultures and if remains negative by tomorrow, recommend stopping antibiotics.  He has requested full viral PCR.  Also if has persistent fever then recommends considering chest/abdomen/pelvis CT  Patient was threatening to  leave AMA prior to ID visit but after meeting with ID, agreeable to stay overnight.  Clinically better.  Defervesced.  Mild pancytopenia.  Follow repeat CBC with differential in a.m.  Urine culture pending.  Blood cultures x2: Negative to date.  I discussed in detail with patient's rheumatologist at [facility] Dr. Carmela Hurt who feels that this is a UTI and recommends that at discharge she should be discharged on antibiotics to cover for the same and should not be discharged without antibiotics.  She also does not think that this is a lupus flare and does not recommend rechecking dsDNA, ANA, Smith antibody which were recently done 3 weeks ago as outpatient.  Pancytopenia  Unclear etiology.?  Related to underlying fever and infectious etiology versus others.  Follow CBC with differential in AM.  Hypokalemia  Replaced.  SLE with vasculitic rash  Follows with [facility] rheumatology, Dr. Nelva Nay, who recommended holding azathioprine and tacrolimus until afebrile 24 hours and 48 hours removed from initial IV antibiotic dose. Continue prednisone and Plaquenil.  1. -urgent f/u Dr. Nelva Nay Rheumatogist within the next 1-2 weeks after discharge    Essential hypertension  Continue lisinopril  UTI -ID DC antibiotics per ID  Hypokalemia******* -4/1 K-Dur 40 mEq x 2      DVT prophylaxis: *** Code Status: *** Family Communication: *** Status is: Inpatient  {Inpatient:23812}  Dispo: The patient is from: {From:23814}              Anticipated d/c is to: {To:23815}              Anticipated d/c date is: {Days:23816}              Patient currently {Medically stable:23817}  Consultants:  ***  Procedures/Significant Events:  ***  I have personally reviewed and interpreted all radiology studies and my findings are as above.  VENTILATOR SETTINGS: ***   Cultures ***  Antimicrobials: Anti-infectives (From admission, onward)   Start     Ordered Stop   01/31/21 1800   vancomycin (VANCOREADY) IVPB 1000 mg/200 mL  Status:  Discontinued        01/31/21 1644 02/01/21 1410   01/31/21 1000  hydroxychloroquine (PLAQUENIL) tablet 300 mg        01/31/21 0055     01/31/21 0130  cefTRIAXone (ROCEPHIN) 1 g in sodium chloride 0.9 % 100 mL IVPB  Status:  Discontinued        01/31/21 0056 02/01/21 1410   01/30/21 1500  aztreonam (AZACTAM) 2 g in sodium chloride 0.9 % 100 mL IVPB  Status:  Discontinued        01/30/21 1450 01/30/21 1454   01/30/21 1500  vancomycin (VANCOCIN) IVPB 1000 mg/200 mL premix        01/30/21 1450 01/30/21 1744   01/30/21 1500  ceFEPIme (MAXIPIME) 2 g in sodium chloride 0.9 % 100 mL IVPB        01/30/21 1454 01/30/21 1603       Devices ***   LINES / TUBES:  ***    Continuous Infusions: . sodium chloride Stopped (01/30/21 1934)     Objective: Vitals:   01/31/21 1344 01/31/21 2111 02/01/21 0601 02/01/21 1341  BP: 118/88 (!) 118/91 120/82 113/68  Pulse: 65 (!) 56 84 83  Resp: Temp: 98.6 F (37 C) 98.8 F (37.1 C) 98.5 F (36.9 C) 100.2 F (37.9 C)  TempSrc: Oral Oral Oral Oral  SpO2: 100% 100% 100% 100%  Weight:      Height:        Intake/Output Summary (Last 24 hours) at 02/01/2021 1500 Last data filed at 02/01/2021 1400 Gross per 24 hour  Intake 1660.07 ml  Output 1050 ml  Net 610.07 ml   Filed Weights   01/30/21 1414 01/30/21 1415  Weight: 50.8 kg 50.8 kg    Examination:  General: No acute respiratory distress Eyes: negative scleral hemorrhage, negative anisocoria, negative icterus*** ENT: Negative Runny nose, negative gingival bleeding,*** Neck:  Negative scars, masses, torticollis, lymphadenopathy, JVD*** Lungs: Clear to auscultation bilaterally without wheezes or crackles Cardiovascular: Regular rate and rhythm without murmur gallop or rub normal S1 and S2 Abdomen: negative abdominal pain, nondistended, positive soft, bowel sounds, no rebound, no ascites, no appreciable mass Extremities: No  significant cyanosis, clubbing, or edema bilateral lower extremities Skin: Negative rashes, lesions, ulcers*** Psychiatric:  Negative depression, negative anxiety, negative fatigue, negative mania *** Central nervous system:  Cranial nerves II through XII intact, tongue/uvula midline, all extremities muscle strength 5/5, sensation intact throughout, finger nose finger bilateral within normal limits, quick finger touch bilateral within normal limits, negative Romberg sign, heel to shin bilateral within normal limits, standing on 1 foot bilateral within normal limits, walking on tiptoes within normal limits, walking on heels within normal limits, negative dysarthria, negative expressive aphasia, negative receptive aphasia.***  .     Data Reviewed: Care during the described time interval was provided by me .  I have reviewed this patient's available data, including medical history, events of note, physical examination, and all test results as part of my evaluation.  CBC: Recent Labs  Lab 01/30/21 1430 01/31/21 0427 02/01/21 0415  WBC 6.0 3.6* 5.2  NEUTROABS 5.3  --  3.2  HGB 13.2 11.5* 11.5*  HCT 37.6 34.2* 35.3*  MCV 86.6 90.7 92.4  PLT 153 142* 138*   Basic Metabolic Panel: Recent Labs  Lab 01/30/21 1430 01/31/21 0427 02/01/21 0415  NA 131* 138 138  K 3.4* 4.3 3.3*  CL 100 107 108  CO2 20* 23 18*  GLUCOSE 104* 101* 79  BUN 14 12 12   CREATININE 0.93 0.67 0.79  CALCIUM 8.7* 8.8* 8.7*  MG  --   --  2.0   GFR: Estimated Creatinine Clearance: 72.7 mL/min (by C-G formula based on SCr of 0.79 mg/dL). Liver Function Tests: Recent Labs  Lab 01/30/21 1430  AST 31  ALT 21  ALKPHOS 60  BILITOT 0.7  PROT 7.1  ALBUMIN 4.1   No results for input(s): LIPASE, AMYLASE in the last 168 hours. No results for input(s): AMMONIA in the last 168 hours. Coagulation Profile: Recent Labs  Lab 01/30/21 1430  INR 1.1   Cardiac Enzymes: No results for input(s): CKTOTAL, CKMB, CKMBINDEX,  TROPONINI in the last 168 hours. BNP (last 3 results) No results for input(s): PROBNP in the last 8760 hours. HbA1C: No results for input(s): HGBA1C in the last 72 hours. CBG: No results for input(s): GLUCAP in the last 168 hours. Lipid Profile: No results for input(s): CHOL, HDL, LDLCALC, TRIG, CHOLHDL, LDLDIRECT in the last 72 hours. Thyroid Function Tests: No results for input(s): TSH, T4TOTAL, FREET4, T3FREE, THYROIDAB in the last 72 hours. Anemia Panel: No results for input(s): VITAMINB12, FOLATE, FERRITIN, TIBC, IRON, RETICCTPCT in the last 72 hours. Sepsis Labs: Recent Labs  Lab 01/30/21 1449  LATICACIDVEN 1.0    Recent Results (from the past 240 hour(s))  Culture, blood (x 2)     Status: None (Preliminary result)   Collection Time: 01/30/21  2:30 PM   Specimen: Left Antecubital; Blood  Result Value Ref Range Status   Specimen Description   Final    LEFT ANTECUBITAL Performed at [facility], [address] [place], [place]    Special Requests   Final    BOTTLES DRAWN AEROBIC AND ANAEROBIC Blood Culture adequate volume Performed at [facility], [address] [place], [place]    Culture   Final    NO GROWTH 2 DAYS Performed at [facility], [address] [place], [place]    Report Status PENDING  Incomplete  Culture, blood (x 2)     Status: None (Preliminary result)   Collection Time: 01/30/21  2:47 PM   Specimen: Right Antecubital; Blood  Result Value Ref Range Status   Specimen Description   Final    RIGHT ANTECUBITAL Performed at [facility], [address] [place], [place]    Special Requests   Final    BOTTLES DRAWN AEROBIC AND ANAEROBIC Blood Culture adequate volume Performed at [facility], [address] [place], [place]    Culture   Final    NO GROWTH 2 DAYS Performed at [facility], [address] [place], [place]     Report Status PENDING  Incomplete  Group A Strep by PCR     Status: None   Collection Time: 01/30/21  2:54 PM   Specimen: Throat; Sterile Swab  Result Value Ref Range Status   Group A Strep by PCR NOT DETECTED NOT DETECTED Final    Comment: Performed at [facility],  [address] [place], [place] [address]  Resp Panel by RT-PCR (Flu A&B, Covid) Nasopharyngeal Swab     Status: None   Collection Time: 01/30/21  2:54 PM   Specimen: Nasopharyngeal Swab; Nasopharyngeal(NP) swabs in vial transport medium  Result Value Ref Range Status   SARS Coronavirus 2 by RT PCR NEGATIVE NEGATIVE Final    Comment: (NOTE) SARS-CoV-2 target nucleic acids are NOT DETECTED.  The SARS-CoV-2 RNA is generally detectable in upper respiratory specimens during the acute phase of infection. The lowest concentration of SARS-CoV-2 viral copies this assay can detect is 138 copies/mL. A negative result does not preclude SARS-Cov-2 infection and should not be used as the sole basis for treatment or other patient management decisions. A negative result may occur with  improper specimen collection/handling, submission of specimen other than nasopharyngeal swab, presence of viral mutation(s) within the areas targeted by this assay, and inadequate number of viral copies(<138 copies/mL). A negative result must be combined with clinical observations, patient history, and epidemiological information. The expected result is Negative.  Fact Sheet for Patients:  BloggerCourse.com  Fact Sheet for Healthcare Providers:  SeriousBroker.it  This test is no t yet approved or cleared by the [place] FDA and  has been authorized for detection and/or diagnosis of SARS-CoV-2 by FDA under an Emergency Use Authorization (EUA). This EUA will remain  in effect (meaning this test can be used) for the duration of the COVID-19 declaration under Section 564(b)(1) of the Act,  21 U.S.C.section 360bbb-3(b)(1), unless the authorization is terminated  or revoked sooner.       Influenza A by PCR NEGATIVE NEGATIVE Final   Influenza B by PCR NEGATIVE NEGATIVE Final    Comment: (NOTE) The Xpert Xpress SARS-CoV-2/FLU/RSV plus assay is intended as an aid in the diagnosis of influenza from Nasopharyngeal swab specimens and should not be used as a sole basis for treatment. Nasal washings and aspirates are unacceptable for Xpert Xpress SARS-CoV-2/FLU/RSV testing.  Fact Sheet for Patients: BloggerCourse.com  Fact Sheet for Healthcare Providers: SeriousBroker.it  This test is not yet approved or cleared by the [place] FDA and has been authorized for detection and/or diagnosis of SARS-CoV-2 by FDA under an Emergency Use Authorization (EUA). This EUA will remain in effect (meaning this test can be used) for the duration of the COVID-19 declaration under Section 564(b)(1) of the Act, 21 U.S.C. section 360bbb-3(b)(1), unless the authorization is terminated or revoked.  Performed at [facility], [address], [place], [place] [address]   Urine Culture     Status: None   Collection Time: 01/30/21  4:37 PM   Specimen: Urine, Random  Result Value Ref Range Status   Specimen Description   Final    URINE, RANDOM Performed at [facility], [address] [place], [place] [address]    Special Requests   Final    Immunocompromised Performed at [facility], [address] [place], [place] [address]    Culture   Final    NO GROWTH Performed at [facility], [address], [place], [place] [address]    Report Status 02/01/2021 FINAL  Final  Respiratory (~20 pathogens) panel by PCR     Status: None   Collection Time: 01/31/21  2:30 PM   Specimen: Nasopharyngeal Swab; Respiratory  Result Value Ref Range Status   Adenovirus NOT DETECTED NOT DETECTED Final   Coronavirus  229E NOT DETECTED NOT DETECTED Final  Comment: (NOTE) The Coronavirus on the Respiratory Panel, DOES NOT test for the novel  Coronavirus (2019 nCoV)    Coronavirus HKU1 NOT DETECTED NOT DETECTED Final   Coronavirus NL63 NOT DETECTED NOT DETECTED Final   Coronavirus OC43 NOT DETECTED NOT DETECTED Final   Metapneumovirus NOT DETECTED NOT DETECTED Final   Rhinovirus / Enterovirus NOT DETECTED NOT DETECTED Final   Influenza A NOT DETECTED NOT DETECTED Final   Influenza B NOT DETECTED NOT DETECTED Final   Parainfluenza Virus 1 NOT DETECTED NOT DETECTED Final   Parainfluenza Virus 2 NOT DETECTED NOT DETECTED Final   Parainfluenza Virus 3 NOT DETECTED NOT DETECTED Final   Parainfluenza Virus 4 NOT DETECTED NOT DETECTED Final   Respiratory Syncytial Virus NOT DETECTED NOT DETECTED Final   Bordetella pertussis NOT DETECTED NOT DETECTED Final   Bordetella Parapertussis NOT DETECTED NOT DETECTED Final   Chlamydophila pneumoniae NOT DETECTED NOT DETECTED Final   Mycoplasma pneumoniae NOT DETECTED NOT DETECTED Final    Comment: Performed at [facility], [address] [place], [place] [address]         Radiology Studies: DG Chest Port 1 View  Result Date: 01/30/2021 CLINICAL DATA:  Fever EXAM: PORTABLE CHEST 1 VIEW COMPARISON:  10/07/2017 FINDINGS: No consolidation, features of edema, pneumothorax, or effusion. Pulmonary vascularity is normally distributed. The cardiomediastinal contours are unremarkable. Metallic bilateral nipple ornamentation is noted. Corticated mineralization seen adjacent the left coracoid, possibly developmental given appearance on multiple prior exams. No other acute osseous or soft tissue abnormality. IMPRESSION: No acute cardiopulmonary abnormality. Electronically Signed   By: Kreg ShropshirePrice  DeHay M.D.   On: 01/30/2021 16:19        Scheduled Meds: . cephALEXin  500 mg Oral Q8H  . enoxaparin (LOVENOX) injection  40 mg Subcutaneous Q24H  . hydroxychloroquine  300  mg Oral Daily  . lisinopril  10 mg Oral Daily  . potassium chloride  40 mEq Oral BID  . predniSONE  20 mg Oral Q breakfast   Continuous Infusions: . sodium chloride Stopped (01/30/21 1934)     LOS: 1 day    Time spent:40 min***    WOODS, Roselind MessierURTIS J, MD [facility]   If 7PM-7AM, please contact night-coverage 02/01/2021, 3:00 PM

## 2021-02-01 NOTE — Plan of Care (Signed)
Instructions were reviewed with patient. All questions were answered. Patient was transported to main entrance by wheelchair. ° °

## 2021-02-01 NOTE — Progress Notes (Signed)
[facility] for Infectious Disease  Date of Admission:  01/30/2021     CC: Sirs/sepsis Immunosuppressed status  Lines: Peripheral iv's  Abx: 3/30-c vanc 3/30-c ceftriaxone  ASSESSMENT: #sirs/sepsis #flu like illness with diarrhea #thrombocytopenia Nonspecific presentation. Likely a viral process. resp pcr negative but myriad virus not catched on resp pcr Tick born illness a possibility given initial diarrhea and now thrombocytopenia. However patietn much improved hiv screen negative, but discussed with her acute retroviral syndrome possible with her being sexually active, and will send hiv pcr for this Pyuria ?lupus; ucx negative and no uti sx  -stop abx -hiv pcr -ok to discharge with close f/u with rheumatology in 1-2 weeks -I will follow the labs and call patient if abnormal -I also discussed with her to talk to her rheumatologist about considering pjp prophylaxis if her steroid use will be long > 2 weeks at > 15 mg daily -relay plan to Dr Sherral Hammers of her primary team   I spent more than 35 minute reviewing data/chart, and coordinating care and >50% direct face to face time providing counseling/discussing diagnostics/treatment plan with patient  Principal Problem:   SIRS (systemic inflammatory response syndrome) ([place]) Active Problems:   Lupus ([place])   Hypertension   Immunosuppressed status ([place])   Fever   Allergies  Allergen Reactions  . Amoxicillin Other (See Comments)    Scheduled Meds: . enoxaparin (LOVENOX) injection  40 mg Subcutaneous Q24H  . hydroxychloroquine  300 mg Oral Daily  . lisinopril  10 mg Oral Daily  . potassium chloride  40 mEq Oral BID  . predniSONE  20 mg Oral Q breakfast   Continuous Infusions: . sodium chloride Stopped (01/30/21 1934)  . cefTRIAXone (ROCEPHIN)  IV 1 g (02/01/21 0127)  . vancomycin 1,000 mg (01/31/21 1744)   PRN Meds:.sodium chloride, acetaminophen **OR** acetaminophen, ondansetron **OR** ondansetron  (ZOFRAN) IV   SUBJECTIVE: Patient continues to feel better in terms of energy/appetite No further diarrhea since onset of sx No new rash No new joint pain No further n No cough/abd pain No headache Bcx/ucx ngtd Resp pcr negative hiv screen negative  Platelet is a little lower today  Review of Systems: ROS All other ROS was negative, except mentioned above     OBJECTIVE: Vitals:   01/31/21 1344 01/31/21 2111 02/01/21 0601 02/01/21 1341  BP: 118/88 (!) 118/91 120/82 113/68  Pulse: 65 (!) 56 84 83  Resp: _0 Temp: 98.6 F (37 C) 98.8 F (37.1 C) 98.5 F (36.9 C) 100.2 F (37.9 C)  TempSrc: Oral Oral Oral Oral  SpO2: 100% 100% 100% 100%  Weight:      Height:       Body mass index is 25.11 kg/m.  Physical Exam General/constitutional: no distress, pleasant HEENT: Normocephalic, PER, Conj Clear, EOMI, Oropharynx clear Neck supple CV: rrr no mrg Lungs: clear to auscultation, normal respiratory effort Abd: Soft, Nontender Ext: no edema Skin: stable plaque like hyperpigmented/skin tone rash on face/ext; no new rash Neuro: nonfocal MSK: no peripheral joint swelling/tenderness/warmth; back spines nontender    Lab Results Lab Results  Component Value Date   WBC 5.2 02/01/2021   HGB 11.5 (L) 02/01/2021   HCT 35.3 (L) 02/01/2021   MCV 92.4 02/01/2021   PLT 138 (L) 02/01/2021    Lab Results  Component Value Date   CREATININE 0.79 02/01/2021   BUN 12 02/01/2021   NA 138 02/01/2021   K 3.3 (L) 02/01/2021  CL 108 02/01/2021   CO2 18 (L) 02/01/2021    Lab Results  Component Value Date   ALT 21 01/30/2021   AST 31 01/30/2021   ALKPHOS 60 01/30/2021   BILITOT 0.7 01/30/2021      Microbiology: Recent Results (from the past 240 hour(s))  Culture, blood (x 2)     Status: None (Preliminary result)   Collection Time: 01/30/21  2:30 PM   Specimen: Left Antecubital; Blood  Result Value Ref Range Status   Specimen Description   Final    LEFT  ANTECUBITAL Performed at [facility], [address] [place], [place] [address]    Special Requests   Final    BOTTLES DRAWN AEROBIC AND ANAEROBIC Blood Culture adequate volume Performed at [facility], [address] [place], [place] [address]    Culture   Final    NO GROWTH < 24 HOURS Performed at [facility], [place] [address] [place], [place] [address]    Report Status PENDING  Incomplete  Culture, blood (x 2)     Status: None (Preliminary result)   Collection Time: 01/30/21  2:47 PM   Specimen: Right Antecubital; Blood  Result Value Ref Range Status   Specimen Description   Final    RIGHT ANTECUBITAL Performed at [facility], [address] [place], [place] [address]    Special Requests   Final    BOTTLES DRAWN AEROBIC AND ANAEROBIC Blood Culture adequate volume Performed at [facility], [address] [place], [place] [address]    Culture   Final    NO GROWTH < 24 HOURS Performed at [facility], [place] [address], [place], [place] [address]    Report Status PENDING  Incomplete  Group A Strep by PCR     Status: None   Collection Time: 01/30/21  2:54 PM   Specimen: Throat; Sterile Swab  Result Value Ref Range Status   Group A Strep by PCR NOT DETECTED NOT DETECTED Final    Comment: Performed at [facility], [address] [place], [place] [address]  Resp Panel by RT-PCR (Flu A&B, Covid) Nasopharyngeal Swab     Status: None   Collection Time: 01/30/21  2:54 PM   Specimen: Nasopharyngeal Swab; Nasopharyngeal(NP) swabs in vial transport medium  Result Value Ref Range Status   SARS Coronavirus 2 by RT PCR NEGATIVE NEGATIVE Final    Comment: (NOTE) SARS-CoV-2 target nucleic acids are NOT DETECTED.  The SARS-CoV-2 RNA is generally detectable in upper respiratory specimens during the acute phase of infection. The lowest concentration of SARS-CoV-2 viral copies this assay can detect is 138 copies/mL. A  negative result does not preclude SARS-Cov-2 infection and should not be used as the sole basis for treatment or other patient management decisions. A negative result may occur with  improper specimen collection/handling, submission of specimen other than nasopharyngeal swab, presence of viral mutation(s) within the areas targeted by this assay, and inadequate number of viral copies(<138 copies/mL). A negative result must be combined with clinical observations, patient history, and epidemiological information. The expected result is Negative.  Fact Sheet for Patients:  EntrepreneurPulse.com.au  Fact Sheet for Healthcare Providers:  IncredibleEmployment.be  This test is no t yet approved or cleared by the [place] FDA and  has been authorized for detection and/or diagnosis of SARS-CoV-2 by FDA under an Emergency Use Authorization (EUA). This EUA will remain  in effect (meaning this test can be used) for the duration of the COVID-19 declaration under Section 564(b)(1) of the Act, 21 U.S.C.section 360bbb-3(b)(1), unless the authorization is terminated  or revoked sooner.       Influenza A by PCR NEGATIVE NEGATIVE Final   Influenza B by PCR NEGATIVE NEGATIVE Final    Comment: (NOTE) The Xpert Xpress SARS-CoV-2/FLU/RSV plus assay is intended as an aid in the diagnosis of influenza from Nasopharyngeal swab specimens and should not be used as a sole basis for treatment. Nasal washings and aspirates are unacceptable for Xpert Xpress SARS-CoV-2/FLU/RSV testing.  Fact Sheet for Patients: EntrepreneurPulse.com.au  Fact Sheet for Healthcare Providers: IncredibleEmployment.be  This test is not yet approved or cleared by the [place] FDA and has been authorized for detection and/or diagnosis of SARS-CoV-2 by FDA under an Emergency Use Authorization (EUA). This EUA will remain in effect (meaning this test can  be used) for the duration of the COVID-19 declaration under Section 564(b)(1) of the Act, 21 U.S.C. section 360bbb-3(b)(1), unless the authorization is terminated or revoked.  Performed at [facility], [address], [place], [place] [address]   Urine Culture     Status: None   Collection Time: 01/30/21  4:37 PM   Specimen: Urine, Random  Result Value Ref Range Status   Specimen Description   Final    URINE, RANDOM Performed at [facility], [address] [place], [place] [address]    Special Requests   Final    Immunocompromised Performed at [facility], [address] [place], [place] [address]    Culture   Final    NO GROWTH Performed at [facility], [place] [address], [place], [place] [address]    Report Status 02/01/2021 FINAL  Final  Respiratory (~20 pathogens) panel by PCR     Status: None   Collection Time: 01/31/21  2:30 PM   Specimen: Nasopharyngeal Swab; Respiratory  Result Value Ref Range Status   Adenovirus NOT DETECTED NOT DETECTED Final   Coronavirus 229E NOT DETECTED NOT DETECTED Final    Comment: (NOTE) The Coronavirus on the Respiratory Panel, DOES NOT test for the novel  Coronavirus (2019 nCoV)    Coronavirus HKU1 NOT DETECTED NOT DETECTED Final   Coronavirus NL63 NOT DETECTED NOT DETECTED Final   Coronavirus OC43 NOT DETECTED NOT DETECTED Final   Metapneumovirus NOT DETECTED NOT DETECTED Final   Rhinovirus / Enterovirus NOT DETECTED NOT DETECTED Final   Influenza A NOT DETECTED NOT DETECTED Final   Influenza B NOT DETECTED NOT DETECTED Final   Parainfluenza Virus 1 NOT DETECTED NOT DETECTED Final   Parainfluenza Virus 2 NOT DETECTED NOT DETECTED Final   Parainfluenza Virus 3 NOT DETECTED NOT DETECTED Final   Parainfluenza Virus 4 NOT DETECTED NOT DETECTED Final   Respiratory Syncytial Virus NOT DETECTED NOT DETECTED Final   Bordetella pertussis NOT DETECTED NOT DETECTED Final   Bordetella Parapertussis NOT  DETECTED NOT DETECTED Final   Chlamydophila pneumoniae NOT DETECTED NOT DETECTED Final   Mycoplasma pneumoniae NOT DETECTED NOT DETECTED Final    Comment: Performed at [facility], [place] [address], [place], [place] [address]     Serology: 3/31 resp viral pcr negative; hiv screen negative  Micro: 3/30 ucx and bcx ngtd  Imaging: If present, new imagings (plain films, ct scans, and mri) have been personally visualized and interpreted; radiology reports have been reviewed. Decision making incorporated into  the Impression / Recommendations.   Jabier Mutton, [place] for Infectious [place] [phone] pager    02/01/2021, 1:59 PM

## 2021-02-02 LAB — HIV-1 RNA QUANT-NO REFLEX-BLD
HIV 1 RNA Quant: <20 copies/mL
LOG10 HIV-1 RNA: UNDETERMINED log10copy/mL

## 2021-02-03 NOTE — Discharge Summary (Signed)
Physician Discharge Summary  Darlene Ruiz:[id number] DOB: 09-19-1996 DOA: 01/30/2021  PCP: Darlene Reichmann, DO  Admit date: 01/30/2021 Discharge date: 02/03/2021  Time spent: 35 minutes  Recommendations for Outpatient Follow-up:   SIRS/febrile illness  Presented with fever as high as 102.7 F, tachycardia, body aches, fatigue, chills and loose stools.  No obvious infectious source based on signs and symptoms. No sick contacts, insect bites or recent travel.  Chronically immunosuppressed from lupus meds.  Urine microscopy is abnormal but patient lacks UTI symptoms, does not rule out UTI.  COVID-19 and flu panel PCR negative. Chest x-ray without pneumonia. Viral etiology possible including gastroenteritis.  As per admitting MDs/EDP discussion with her primary rheumatologist, continuing prior home dose of prednisone but holding azathioprine and tacrolimus.  ID consulted.  Discussed with Dr. Rutha Ruiz. Indicates that patient is at high risk for viral/bacterial processesand opportunistic infections. He recommends continuing vancomycin, ceftriaxone, follow-up blood cultures and if remains negative by tomorrow, recommend stopping antibiotics. He has requested full viral PCR. Also if has persistent fever then recommends considering chest/abdomen/pelvis CT  Clinically better. Defervesced. Mild pancytopenia. Follow repeat CBC with differential ina.m. Urine culture,Blood cultures x2: Negative to date. I discussed in detail with patient's rheumatologistat [facility] Dr. Norton Pastel feels that this is a UTI and recommends that at discharge she should be discharged on antibiotics.  Patient was not discharged on antibiotics as all symptoms resolved.  This was per ID recommendation.    Pancytopenia  Unclear etiology.? Related to underlying fever and infectious etiology versus others.  Improved upon discharge.  Hypokalemia -4/1 K-Dur 40 mEq x 2 prior to discharge  SLE with  vasculitic rash  Follows with [facility], Dr.Rebecca Carmela Ruiz, who recommended holding azathioprine and tacrolimus until afebrile 24 hours and 48 hours removed from initial IV antibiotic dose. Continue prednisone and Plaquenil.  urgent f/u Dr.Rebecca Ruiz Rheumatogist within the next 1-2 weeks after discharge  Essential hypertension  Continue lisinopril  UTI -ID DC antibiotics per ID   Discharge Diagnoses:  Principal Problem:   SIRS (systemic inflammatory response syndrome) (HCC) Active Problems:   Lupus (HCC)   Hypertension   Immunosuppressed status (HCC)   Fever   Discharge Condition: Stable  Diet recommendation: Regular  Filed Weights   01/30/21 1414 01/30/21 1415  Weight: 50.8 kg 50.8 kg    History of present illness:  [name], lupus nephritis, hypertension,   Presented to the ED with symptoms of chills, fatigue and body aches of 5 days duration. She has had a recent lupus flare for which she is receiving weekly IV Solu-Medrol through her rheumatologist at [facility] and has completed 1 dose thus far. Admitted for evaluation and management of SIRS/febrile illness of unclear source. Infectious disease consulted.  Hospital Course:  See above   Consultations: ID  Cultures   3/30 urine negative 3/31 respiratory virus panel negative  Antibiotics Anti-infectives (From admission, onward)   Start     Ordered Stop   02/01/21 2200  cephALEXin (KEFLEX) capsule 500 mg  Status:  Discontinued        02/01/21 1456 02/01/21 2100   02/01/21 0000  cephALEXin (KEFLEX) 500 MG capsule  Status:  Discontinued        02/01/21 1500 02/01/21    01/31/21 1800  vancomycin (VANCOREADY) IVPB 1000 mg/200 mL  Status:  Discontinued        01/31/21 1644 02/01/21 1410   01/31/21 1000  hydroxychloroquine (PLAQUENIL) tablet 300 mg  Status:  Discontinued        01/31/21 0055 02/01/21 2100   01/31/21 0130  cefTRIAXone (ROCEPHIN) 1 g in  sodium chloride 0.9 % 100 mL IVPB  Status:  Discontinued        01/31/21 0056 02/01/21 1410   01/30/21 1500  aztreonam (AZACTAM) 2 g in sodium chloride 0.9 % 100 mL IVPB  Status:  Discontinued        01/30/21 1450 01/30/21 1454   01/30/21 1500  vancomycin (VANCOCIN) IVPB 1000 mg/200 mL premix        01/30/21 1450 01/30/21 1744   01/30/21 1500  ceFEPIme (MAXIPIME) 2 g in sodium chloride 0.9 % 100 mL IVPB        01/30/21 1454 01/30/21 1603       Discharge Exam: Vitals:   01/31/21 1344 01/31/21 2111 02/01/21 0601 02/01/21 1341  BP: 118/88 (!) 118/91 120/82 113/68  Pulse: 65 (!) 56 84 83  Resp: 18 16 16 18   Temp: 98.6 F (37 C) 98.8 F (37.1 C) 98.5 F (36.9 C) 100.2 F (37.9 C)  TempSrc: Oral Oral Oral Oral  SpO2: 100% 100% 100% 100%  Weight:      Height:       Physical Exam:  General: No acute respiratory distress Eyes: negative scleral hemorrhage, negative anisocoria, negative icterus ENT: Negative Runny nose, negative gingival bleeding, Neck:  Negative scars, masses, torticollis, lymphadenopathy, JVD Lungs: Clear to auscultation bilaterally without wheezes or crackles Cardiovascular: Regular rate and rhythm without murmur gallop or rub normal S1 and S2  Discharge Instructions   Allergies as of 02/01/2021      Reactions   Amoxicillin Other (See Comments)      Medication List    STOP taking these medications   amoxicillin 875 MG tablet Commonly known as: AMOXIL   clindamycin 1 % lotion Commonly known as: CLEOCIN T   Guaifenesin 1200 MG Tb12   ibuprofen 800 MG tablet Commonly known as: ADVIL   promethazine-dextromethorphan 6.25-15 MG/5ML syrup Commonly known as: PROMETHAZINE-DM   traMADol 50 MG tablet Commonly known as: ULTRAM     TAKE these medications   azaTHIOprine 50 MG tablet Commonly known as: IMURAN Take 100 mg by mouth daily.   desonide 0.05 % ointment Commonly known as: DESOWEN Apply 1 application topically daily.   Envarsus XR 4 MG  Tb24 Generic drug: Tacrolimus ER Take 1 tablet by mouth daily.   fluocinonide ointment 0.05 % Commonly known as: LIDEX Apply 1 application topically See admin instructions. Apply 2 times a day to affected areas of lupus. Strict sun protection.  Stop when smooth.   hydroxychloroquine 200 MG tablet Commonly known as: PLAQUENIL Take 300 mg by mouth daily.   lisinopril 10 MG tablet Commonly known as: ZESTRIL Take 10 mg by mouth daily.   loratadine 10 MG tablet Commonly known as: CLARITIN Take 10 mg by mouth daily as needed. allergies   medroxyPROGESTERone 150 MG/ML injection Commonly known as: DEPO-PROVERA Inject 150 mg into the muscle every 3 (three) months.   montelukast 10 MG tablet Commonly known as: SINGULAIR Take 10 mg by mouth daily as needed. allergies   ondansetron 4 MG tablet Commonly known as: ZOFRAN Take 1 tablet (4 mg total) by mouth every 6 (six) hours as needed for nausea.   predniSONE 20 MG tablet Commonly known as: DELTASONE Take 1 tablet (20 mg total) by mouth daily with breakfast. What changed:   medication strength  how much to take  when to take this  valACYclovir 1000 MG tablet Commonly known as: VALTREX Take 1,000 mg by mouth daily as needed (OB).      Allergies  Allergen Reactions  . Amoxicillin Other (See Comments)    Follow-up Information    Darlene Reichmann, DO. Schedule an appointment as soon as possible for a visit in 1 week(s).   Specialty: Family Medicine Why: urgent f/u with her pcp within the next 1-2 weeks after discharge Contact information: [address] [place] [address] [phone]                The results of significant diagnostics from this hospitalization (including imaging, microbiology, ancillary and laboratory) are listed below for reference.    Significant Diagnostic Studies: DG Chest Port 1 View  Result Date: 01/30/2021 CLINICAL DATA:  Fever EXAM: PORTABLE CHEST 1 VIEW COMPARISON:   10/07/2017 FINDINGS: No consolidation, features of edema, pneumothorax, or effusion. Pulmonary vascularity is normally distributed. The cardiomediastinal contours are unremarkable. Metallic bilateral nipple ornamentation is noted. Corticated mineralization seen adjacent the left coracoid, possibly developmental given appearance on multiple prior exams. No other acute osseous or soft tissue abnormality. IMPRESSION: No acute cardiopulmonary abnormality. Electronically Signed   By: Kreg Shropshire M.D.   On: 01/30/2021 16:19    Microbiology: Recent Results (from the past 240 hour(s))  Culture, blood (x 2)     Status: None (Preliminary result)   Collection Time: 01/30/21  2:30 PM   Specimen: Left Antecubital; Blood  Result Value Ref Range Status   Specimen Description   Final    LEFT ANTECUBITAL Performed at [facility], [address] [place], [place] [address]    Special Requests   Final    BOTTLES DRAWN AEROBIC AND ANAEROBIC Blood Culture adequate volume Performed at [facility], [address] [place], [place] [address]    Culture   Final    NO GROWTH 3 DAYS Performed at [facility], [address] [place], [place] [address]    Report Status PENDING  Incomplete  Culture, blood (x 2)     Status: None (Preliminary result)   Collection Time: 01/30/21  2:47 PM   Specimen: Right Antecubital; Blood  Result Value Ref Range Status   Specimen Description   Final    RIGHT ANTECUBITAL Performed at [facility], [address] [place], [place] [address]    Special Requests   Final    BOTTLES DRAWN AEROBIC AND ANAEROBIC Blood Culture adequate volume Performed at [facility], [address] [place], [place] [address]    Culture   Final    NO GROWTH 3 DAYS Performed at [facility], [address], [place], [place] [address]    Report Status PENDING  Incomplete  Group A Strep by PCR     Status: None   Collection Time: 01/30/21   2:54 PM   Specimen: Throat; Sterile Swab  Result Value Ref Range Status   Group A Strep by PCR NOT DETECTED NOT DETECTED Final    Comment: Performed at [facility], [address] [place], [place] [address]  Resp Panel by RT-PCR (Flu A&B, Covid) Nasopharyngeal Swab     Status: None   Collection Time: 01/30/21  2:54 PM   Specimen: Nasopharyngeal Swab; Nasopharyngeal(NP) swabs in vial transport medium  Result Value Ref Range Status   SARS Coronavirus 2 by RT PCR NEGATIVE NEGATIVE Final    Comment: (  NOTE) SARS-CoV-2 target nucleic acids are NOT DETECTED.  The SARS-CoV-2 RNA is generally detectable in upper respiratory specimens during the acute phase of infection. The lowest concentration of SARS-CoV-2 viral copies this assay can detect is 138 copies/mL. A negative result does not preclude SARS-Cov-2 infection and should not be used as the sole basis for treatment or other patient management decisions. A negative result may occur with  improper specimen collection/handling, submission of specimen other than nasopharyngeal swab, presence of viral mutation(s) within the areas targeted by this assay, and inadequate number of viral copies(<138 copies/mL). A negative result must be combined with clinical observations, patient history, and epidemiological information. The expected result is Negative.  Fact Sheet for Patients:  BloggerCourse.com  Fact Sheet for Healthcare Providers:  SeriousBroker.it  This test is no t yet approved or cleared by the [place] FDA and  has been authorized for detection and/or diagnosis of SARS-CoV-2 by FDA under an Emergency Use Authorization (EUA). This EUA will remain  in effect (meaning this test can be used) for the duration of the COVID-19 declaration under Section 564(b)(1) of the Act, 21 U.S.C.section 360bbb-3(b)(1), unless the authorization is terminated  or revoked sooner.        Influenza A by PCR NEGATIVE NEGATIVE Final   Influenza B by PCR NEGATIVE NEGATIVE Final    Comment: (NOTE) The Xpert Xpress SARS-CoV-2/FLU/RSV plus assay is intended as an aid in the diagnosis of influenza from Nasopharyngeal swab specimens and should not be used as a sole basis for treatment. Nasal washings and aspirates are unacceptable for Xpert Xpress SARS-CoV-2/FLU/RSV testing.  Fact Sheet for Patients: BloggerCourse.com  Fact Sheet for Healthcare Providers: SeriousBroker.it  This test is not yet approved or cleared by the [place] FDA and has been authorized for detection and/or diagnosis of SARS-CoV-2 by FDA under an Emergency Use Authorization (EUA). This EUA will remain in effect (meaning this test can be used) for the duration of the COVID-19 declaration under Section 564(b)(1) of the Act, 21 U.S.C. section 360bbb-3(b)(1), unless the authorization is terminated or revoked.  Performed at [facility], [address] [place], [place] [address]   Urine Culture     Status: None   Collection Time: 01/30/21  4:37 PM   Specimen: Urine, Random  Result Value Ref Range Status   Specimen Description   Final    URINE, RANDOM Performed at [facility], [address] [place], [place] [address]    Special Requests   Final    Immunocompromised Performed at [facility], [address] [place], [place] [address]    Culture   Final    NO GROWTH Performed at [facility], [address], [place], [place] [address]    Report Status 02/01/2021 FINAL  Final  Respiratory (~20 pathogens) panel by PCR     Status: None   Collection Time: 01/31/21  2:30 PM   Specimen: Nasopharyngeal Swab; Respiratory  Result Value Ref Range Status   Adenovirus NOT DETECTED NOT DETECTED Final   Coronavirus 229E NOT DETECTED NOT DETECTED Final    Comment: (NOTE) The Coronavirus on the Respiratory Panel,  DOES NOT test for the novel  Coronavirus (2019 nCoV)    Coronavirus HKU1 NOT DETECTED NOT DETECTED Final   Coronavirus NL63 NOT DETECTED NOT DETECTED Final   Coronavirus OC43 NOT DETECTED NOT DETECTED Final   Metapneumovirus NOT DETECTED NOT DETECTED Final   Rhinovirus / Enterovirus NOT DETECTED  NOT DETECTED Final   Influenza A NOT DETECTED NOT DETECTED Final   Influenza B NOT DETECTED NOT DETECTED Final   Parainfluenza Virus 1 NOT DETECTED NOT DETECTED Final   Parainfluenza Virus 2 NOT DETECTED NOT DETECTED Final   Parainfluenza Virus 3 NOT DETECTED NOT DETECTED Final   Parainfluenza Virus 4 NOT DETECTED NOT DETECTED Final   Respiratory Syncytial Virus NOT DETECTED NOT DETECTED Final   Bordetella pertussis NOT DETECTED NOT DETECTED Final   Bordetella Parapertussis NOT DETECTED NOT DETECTED Final   Chlamydophila pneumoniae NOT DETECTED NOT DETECTED Final   Mycoplasma pneumoniae NOT DETECTED NOT DETECTED Final    Comment: Performed at [facility], [address], [place], [place] [address]     Labs: Basic Metabolic Panel: Recent Labs  Lab 01/30/21 1430 01/31/21 0427 02/01/21 0415  NA 131* 138 138  K 3.4* 4.3 3.3*  CL 100 107 108  CO2 20* 23 18*  GLUCOSE 104* 101* 79  BUN 14 12 12   CREATININE 0.93 0.67 0.79  CALCIUM 8.7* 8.8* 8.7*  MG  --   --  2.0   Liver Function Tests: Recent Labs  Lab 01/30/21 1430  AST 31  ALT 21  ALKPHOS 60  BILITOT 0.7  PROT 7.1  ALBUMIN 4.1   No results for input(s): LIPASE, AMYLASE in the last 168 hours. No results for input(s): AMMONIA in the last 168 hours. CBC: Recent Labs  Lab 01/30/21 1430 01/31/21 0427 02/01/21 0415  WBC 6.0 3.6* 5.2  NEUTROABS 5.3  --  3.2  HGB 13.2 11.5* 11.5*  HCT 37.6 34.2* 35.3*  MCV 86.6 90.7 92.4  PLT 153 142* 138*   Cardiac Enzymes: No results for input(s): CKTOTAL, CKMB, CKMBINDEX, TROPONINI in the last 168 hours. BNP: BNP (last 3 results) No results for input(s): BNP in the last 8760  hours.  ProBNP (last 3 results) No results for input(s): PROBNP in the last 8760 hours.  CBG: No results for input(s): GLUCAP in the last 168 hours.     Signed:  04/03/21, MD [facility]

## 2021-02-05 LAB — CULTURE, BLOOD (ROUTINE X 2)
Culture: NO GROWTH
Culture: NO GROWTH
Special Requests: ADEQUATE
Special Requests: ADEQUATE

## 2021-02-06 DIAGNOSIS — M3214 Glomerular disease in systemic lupus erythematosus: Secondary | ICD-10-CM | POA: Diagnosis not present

## 2021-02-12 DIAGNOSIS — N390 Urinary tract infection, site not specified: Secondary | ICD-10-CM | POA: Diagnosis not present

## 2021-02-12 DIAGNOSIS — M329 Systemic lupus erythematosus, unspecified: Secondary | ICD-10-CM | POA: Diagnosis not present

## 2021-02-12 DIAGNOSIS — Z09 Encounter for follow-up examination after completed treatment for conditions other than malignant neoplasm: Secondary | ICD-10-CM | POA: Diagnosis not present

## 2021-02-13 DIAGNOSIS — M3214 Glomerular disease in systemic lupus erythematosus: Secondary | ICD-10-CM | POA: Diagnosis not present

## 2021-02-18 DIAGNOSIS — Z3042 Encounter for surveillance of injectable contraceptive: Secondary | ICD-10-CM | POA: Diagnosis not present

## 2021-02-27 DIAGNOSIS — M3214 Glomerular disease in systemic lupus erythematosus: Secondary | ICD-10-CM | POA: Diagnosis not present

## 2021-03-14 DIAGNOSIS — N184 Chronic kidney disease, stage 4 (severe): Secondary | ICD-10-CM | POA: Diagnosis not present

## 2021-03-14 DIAGNOSIS — Z87448 Personal history of other diseases of urinary system: Secondary | ICD-10-CM | POA: Diagnosis not present

## 2021-03-14 DIAGNOSIS — N390 Urinary tract infection, site not specified: Secondary | ICD-10-CM | POA: Diagnosis not present

## 2021-03-14 DIAGNOSIS — R809 Proteinuria, unspecified: Secondary | ICD-10-CM | POA: Diagnosis not present

## 2021-03-14 DIAGNOSIS — M329 Systemic lupus erythematosus, unspecified: Secondary | ICD-10-CM | POA: Diagnosis not present

## 2021-04-02 DIAGNOSIS — I150 Renovascular hypertension: Secondary | ICD-10-CM | POA: Diagnosis not present

## 2021-04-02 DIAGNOSIS — L930 Discoid lupus erythematosus: Secondary | ICD-10-CM | POA: Diagnosis not present

## 2021-04-02 DIAGNOSIS — F419 Anxiety disorder, unspecified: Secondary | ICD-10-CM | POA: Diagnosis not present

## 2021-04-02 DIAGNOSIS — R801 Persistent proteinuria, unspecified: Secondary | ICD-10-CM | POA: Diagnosis not present

## 2021-04-02 DIAGNOSIS — Z1331 Encounter for screening for depression: Secondary | ICD-10-CM | POA: Diagnosis not present

## 2021-04-02 DIAGNOSIS — R7989 Other specified abnormal findings of blood chemistry: Secondary | ICD-10-CM | POA: Diagnosis not present

## 2021-04-02 DIAGNOSIS — M329 Systemic lupus erythematosus, unspecified: Secondary | ICD-10-CM | POA: Diagnosis not present

## 2021-04-02 DIAGNOSIS — M328 Other forms of systemic lupus erythematosus: Secondary | ICD-10-CM | POA: Diagnosis not present

## 2021-04-30 DIAGNOSIS — Z113 Encounter for screening for infections with a predominantly sexual mode of transmission: Secondary | ICD-10-CM | POA: Diagnosis not present

## 2021-04-30 DIAGNOSIS — Z130 Encounter for screening for diseases of the blood and blood-forming organs and certain disorders involving the immune mechanism: Secondary | ICD-10-CM | POA: Diagnosis not present

## 2021-04-30 DIAGNOSIS — Z01419 Encounter for gynecological examination (general) (routine) without abnormal findings: Secondary | ICD-10-CM | POA: Diagnosis not present

## 2021-04-30 DIAGNOSIS — Z3042 Encounter for surveillance of injectable contraceptive: Secondary | ICD-10-CM | POA: Diagnosis not present

## 2021-04-30 DIAGNOSIS — Z6824 Body mass index (BMI) 24.0-24.9, adult: Secondary | ICD-10-CM | POA: Diagnosis not present

## 2021-05-13 DIAGNOSIS — Z3042 Encounter for surveillance of injectable contraceptive: Secondary | ICD-10-CM | POA: Diagnosis not present

## 2021-06-15 DIAGNOSIS — Z20822 Contact with and (suspected) exposure to covid-19: Secondary | ICD-10-CM | POA: Diagnosis not present

## 2021-06-27 DIAGNOSIS — Z87448 Personal history of other diseases of urinary system: Secondary | ICD-10-CM | POA: Diagnosis not present

## 2021-06-27 DIAGNOSIS — M329 Systemic lupus erythematosus, unspecified: Secondary | ICD-10-CM | POA: Diagnosis not present

## 2021-06-27 DIAGNOSIS — R809 Proteinuria, unspecified: Secondary | ICD-10-CM | POA: Diagnosis not present

## 2021-06-27 DIAGNOSIS — N181 Chronic kidney disease, stage 1: Secondary | ICD-10-CM | POA: Diagnosis not present

## 2021-07-02 DIAGNOSIS — M328 Other forms of systemic lupus erythematosus: Secondary | ICD-10-CM | POA: Diagnosis not present

## 2021-07-02 DIAGNOSIS — R768 Other specified abnormal immunological findings in serum: Secondary | ICD-10-CM | POA: Diagnosis not present

## 2021-07-02 DIAGNOSIS — I150 Renovascular hypertension: Secondary | ICD-10-CM | POA: Diagnosis not present

## 2021-07-02 DIAGNOSIS — R801 Persistent proteinuria, unspecified: Secondary | ICD-10-CM | POA: Diagnosis not present

## 2021-07-02 DIAGNOSIS — J302 Other seasonal allergic rhinitis: Secondary | ICD-10-CM | POA: Diagnosis not present

## 2021-07-02 DIAGNOSIS — M3214 Glomerular disease in systemic lupus erythematosus: Secondary | ICD-10-CM | POA: Diagnosis not present

## 2021-07-02 DIAGNOSIS — R197 Diarrhea, unspecified: Secondary | ICD-10-CM | POA: Diagnosis not present

## 2021-07-02 DIAGNOSIS — Z79899 Other long term (current) drug therapy: Secondary | ICD-10-CM | POA: Diagnosis not present

## 2021-07-15 ENCOUNTER — Other Ambulatory Visit: Payer: Self-pay

## 2021-07-15 ENCOUNTER — Emergency Department (HOSPITAL_BASED_OUTPATIENT_CLINIC_OR_DEPARTMENT_OTHER)
Admission: EM | Admit: 2021-07-15 | Discharge: 2021-07-15 | Discharge status: Against Medical Advice | Payer: BC Managed Care – PPO

## 2021-07-15 DIAGNOSIS — J329 Chronic sinusitis, unspecified: Secondary | ICD-10-CM | POA: Diagnosis not present

## 2021-07-15 DIAGNOSIS — H02841 Edema of right upper eyelid: Secondary | ICD-10-CM | POA: Diagnosis not present

## 2021-07-24 ENCOUNTER — Ambulatory Visit (HOSPITAL_COMMUNITY)
Admission: EM | Admit: 2021-07-24 | Discharge: 2021-07-24 | Disposition: A | Discharge status: Home / Self Care | Payer: BC Managed Care – PPO

## 2021-07-24 ENCOUNTER — Encounter (HOSPITAL_COMMUNITY): Payer: Self-pay | Admitting: Emergency Medicine

## 2021-07-24 ENCOUNTER — Ambulatory Visit: Payer: Self-pay

## 2021-07-24 ENCOUNTER — Other Ambulatory Visit: Payer: Self-pay

## 2021-07-24 DIAGNOSIS — S39012A Strain of muscle, fascia and tendon of lower back, initial encounter: Secondary | ICD-10-CM | POA: Diagnosis not present

## 2021-07-24 DIAGNOSIS — M62838 Other muscle spasm: Secondary | ICD-10-CM | POA: Diagnosis not present

## 2021-07-24 LAB — POCT URINALYSIS DIPSTICK, ED / UC
Bilirubin Urine: NEGATIVE
Glucose, UA: NEGATIVE mg/dL
Hgb urine dipstick: NEGATIVE
Ketones, ur: NEGATIVE mg/dL
Leukocytes,Ua: NEGATIVE
Nitrite: NEGATIVE
Protein, ur: NEGATIVE mg/dL
Specific Gravity, Urine: 1.025 (ref 1.005–1.030)
Urobilinogen, UA: 0.2 mg/dL (ref 0.0–1.0)
pH: 7 (ref 5.0–8.0)

## 2021-07-24 LAB — POC URINE PREG, ED: Preg Test, Ur: NEGATIVE

## 2021-07-24 MED ORDER — PREDNISONE 20 MG PO TABS: 40.0000 mg | ORAL_TABLET | Freq: Every day | ORAL | 0 refills | Status: AC

## 2021-07-24 MED ORDER — KETOROLAC TROMETHAMINE 30 MG/ML IJ SOLN
INTRAMUSCULAR | Status: AC
  Filled 2021-07-24: qty 1

## 2021-07-24 MED ORDER — KETOROLAC TROMETHAMINE 30 MG/ML IJ SOLN
30.0000 mg | Freq: Once | INTRAMUSCULAR | Status: AC
  Administered 2021-07-24: 30 mg via INTRAMUSCULAR

## 2021-07-24 NOTE — ED Triage Notes (Addendum)
Patient c/o RT sided flank pain x 2 days.   Patient denies dysuria or change in urine characteristics.   Patient endorses nausea.   Patient denies diarrhea and emesis.   Patient attempted to contact Nephrologist but was told to come to UC.  Patient is currently taking Doxycycline for "eye stye".   History of Lupus.   Patient has taken Soma and Tylenol with no relief of symtpoms.

## 2021-07-24 NOTE — Discharge Instructions (Signed)
Take the prednisone daily starting tomorrow.  Take this in the morning.   You can take Tylenol as needed for pain.  You can continue to take the muscle relaxer as prescribed.   You can use heat, ice, or alternate between heat and ice for comfort.  You can use IcyHot, lidocaine patches, biofreeze, aspercreme, bengay, or voltaren gel as needed for pain relief.   Follow up with your primary care provider and nephrologist as soon as possible for re-evaluation.

## 2021-07-24 NOTE — ED Provider Notes (Signed)
[facility]    CSN: [id number] Arrival date & time: 07/24/21  0807      History   Chief Complaint Chief Complaint  Patient presents with   Flank Pain    HPI Darlene Ruiz is a 25 y.o. female.   Patient here for evaluation of right sided flank pain that has been ongoing for the past 2 days.  Denies any dysuria, urgency, or frequency.  Denies any vomiting, diarrhea, or constipation.  Reports a history of lupus.  Reports taking Soma and Tylenol with minimal symptom relief.  Reports back pain feels like a knot and denies any specific alleviating or aggravating factors.  Denies any trauma, injury, or other precipitating event.  Denies any fevers, chest pain, shortness of breath, numbness, tingling, weakness, abdominal pain, or headaches.    The history is provided by the patient.  Flank Pain   Past Medical History:  Diagnosis Date   Hypertension    Lupus (HCC)    Renal disorder     Patient Active Problem List   Diagnosis Date Noted   Fever 01/31/2021   Lupus (HCC)    Hypertension    Immunosuppressed status (HCC)    SIRS (systemic inflammatory response syndrome) (HCC) 01/30/2021    Past Surgical History:  Procedure Laterality Date   RENAL BIOPSY      OB History   No obstetric history on file.      Home Medications    Prior to Admission medications   Medication Sig Start Date End Date Taking? Authorizing Provider  azaTHIOprine (IMURAN) 50 MG tablet Take 100 mg by mouth daily. 11/26/16  Yes [provider]  doxycycline (VIBRA-TABS) 100 MG tablet Take 100 mg by mouth 2 (two) times daily. 07/15/21  Yes [provider]  ENVARSUS XR 4 MG TB24 Take 1 tablet by mouth daily. 10/08/20  Yes [provider]  hydroxychloroquine (PLAQUENIL) 200 MG tablet Take 300 mg by mouth daily.   Yes [provider]  lisinopril (PRINIVIL,ZESTRIL) 10 MG tablet Take 10 mg by mouth daily.   Yes [provider]  predniSONE (DELTASONE) 20  MG tablet Take 2 tablets (40 mg total) by mouth daily for 5 days. 07/24/21 07/29/21 Yes Ivette Loyal, NP  desonide (DESOWEN) 0.05 % ointment Apply 1 application topically daily.    [provider]  fluocinonide ointment (LIDEX) 0.05 % Apply 1 application topically See admin instructions. Apply 2 times a day to affected areas of lupus. Strict sun protection.  Stop when smooth. 03/25/19   [provider]  loratadine (CLARITIN) 10 MG tablet Take 10 mg by mouth daily as needed. allergies    [provider]  medroxyPROGESTERone (DEPO-PROVERA) 150 MG/ML injection Inject 150 mg into the muscle every 3 (three) months. 11/08/20   [provider]  montelukast (SINGULAIR) 10 MG tablet Take 10 mg by mouth daily as needed. allergies    [provider]  ondansetron (ZOFRAN) 4 MG tablet Take 1 tablet (4 mg total) by mouth every 6 (six) hours as needed for nausea. 02/01/21   Drema Dallas, MD  valACYclovir (VALTREX) 1000 MG tablet Take 1,000 mg by mouth daily as needed (OB).    [provider]    Family History Family History  Problem Relation Age of Onset   Hypertension Other     Social History Social History   Tobacco Use   Smoking status: Never   Smokeless tobacco: Never  Substance Use Topics   Alcohol use: No  Drug use: No     Allergies   Amoxicillin   Review of Systems Review of Systems  Genitourinary:  Positive for flank pain. Negative for dysuria, frequency and urgency.  Musculoskeletal:  Positive for back pain.  All other systems reviewed and are negative.   Physical Exam Triage Vital Signs ED Triage Vitals  Enc Vitals Group     BP 07/24/21 0838 128/88     Pulse Rate 07/24/21 0838 60     Resp 07/24/21 0838 15     Temp 07/24/21 0838 98.8 F (37.1 C)     Temp Source 07/24/21 0838 Oral     SpO2 07/24/21 0838 100 %     Weight --      Height --      Head Circumference --      Peak Flow --      Pain Score 07/24/21 0836 8      Pain Loc --      Pain Edu? --      Excl. in GC? --    No data found.  Updated Vital Signs BP 128/88 (BP Location: Right Arm)   Pulse 60   Temp 98.8 F (37.1 C) (Oral)   Resp 15   LMP  (LMP Unknown)   SpO2 100%   Visual Acuity Right Eye Distance:   Left Eye Distance:   Bilateral Distance:    Right Eye Near:   Left Eye Near:    Bilateral Near:     Physical Exam Vitals and nursing note reviewed.  Constitutional:      General: She is not in acute distress.    Appearance: Normal appearance. She is not ill-appearing, toxic-appearing or diaphoretic.  HENT:     Head: Normocephalic and atraumatic.  Eyes:     Conjunctiva/sclera: Conjunctivae normal.  Cardiovascular:     Rate and Rhythm: Normal rate.     Pulses: Normal pulses.  Pulmonary:     Effort: Pulmonary effort is normal.  Abdominal:     General: Abdomen is flat.  Musculoskeletal:        General: Normal range of motion.     Cervical back: Normal and normal range of motion.     Thoracic back: Normal.     Lumbar back: Spasms (right side) and tenderness present. No bony tenderness. Negative right straight leg raise test and negative left straight leg raise test.  Skin:    General: Skin is warm and dry.  Neurological:     General: No focal deficit present.     Mental Status: She is alert and oriented to person, place, and time.  Psychiatric:        Mood and Affect: Mood normal.     UC Treatments / Results  Labs (all labs ordered are listed, but only abnormal results are displayed) Labs Reviewed  POCT URINALYSIS DIPSTICK, ED / UC  POC URINE PREG, ED    EKG   Radiology No results found.  Procedures Procedures (including critical care time)  Medications Ordered in UC Medications  ketorolac (TORADOL) 30 MG/ML injection 30 mg (30 mg Intramuscular Given 07/24/21 0912)    Initial Impression / Assessment and Plan / UC Course  I have reviewed the triage vital signs and the nursing notes.  Pertinent labs &  imaging results that were available during my care of the patient were reviewed by me and considered in my medical decision making (see chart for details).    Assessment negative for red flags or concerns.  Urinalysis with no signs of infection and urine pregnancy negative.  Likely lumbar strain or muscle spasms of the lower back.  Ketorolac IM administered in office.  We will treat with prednisone daily for the next 5 days.  May continue Tylenol and muscle relaxers as needed for pain.  Discussed conservative symptom management including heat and ice.  Follow-up with primary care provider and nephrologist as soon as possible for reevaluation. Final Clinical Impressions(s) / UC Diagnoses   Final diagnoses:  Strain of lumbar region, initial encounter  Muscle spasm     Discharge Instructions      Take the prednisone daily starting tomorrow.  Take this in the morning.   You can take Tylenol as needed for pain.  You can continue to take the muscle relaxer as prescribed.   You can use heat, ice, or alternate between heat and ice for comfort.  You can use IcyHot, lidocaine patches, biofreeze, aspercreme, bengay, or voltaren gel as needed for pain relief.   Follow up with your primary care provider and nephrologist as soon as possible for re-evaluation.      ED Prescriptions     Medication Sig Dispense Auth. Provider   predniSONE (DELTASONE) 20 MG tablet Take 2 tablets (40 mg total) by mouth daily for 5 days. 10 tablet Ivette Loyal, NP      PDMP not reviewed this encounter.   Ivette Loyal, NP 07/24/21 [phone]

## 2021-08-09 DIAGNOSIS — Z3042 Encounter for surveillance of injectable contraceptive: Secondary | ICD-10-CM | POA: Diagnosis not present

## 2021-09-03 DIAGNOSIS — F331 Major depressive disorder, recurrent, moderate: Secondary | ICD-10-CM | POA: Diagnosis not present

## 2021-09-03 DIAGNOSIS — F419 Anxiety disorder, unspecified: Secondary | ICD-10-CM | POA: Diagnosis not present

## 2021-09-03 DIAGNOSIS — F1220 Cannabis dependence, uncomplicated: Secondary | ICD-10-CM | POA: Diagnosis not present

## 2021-09-10 DIAGNOSIS — R197 Diarrhea, unspecified: Secondary | ICD-10-CM | POA: Diagnosis not present

## 2021-09-10 DIAGNOSIS — Z79899 Other long term (current) drug therapy: Secondary | ICD-10-CM | POA: Diagnosis not present

## 2021-09-10 DIAGNOSIS — R768 Other specified abnormal immunological findings in serum: Secondary | ICD-10-CM | POA: Diagnosis not present

## 2021-09-10 DIAGNOSIS — I150 Renovascular hypertension: Secondary | ICD-10-CM | POA: Diagnosis not present

## 2021-09-10 DIAGNOSIS — R801 Persistent proteinuria, unspecified: Secondary | ICD-10-CM | POA: Diagnosis not present

## 2021-09-10 DIAGNOSIS — M3214 Glomerular disease in systemic lupus erythematosus: Secondary | ICD-10-CM | POA: Diagnosis not present

## 2021-09-10 DIAGNOSIS — L959 Vasculitis limited to the skin, unspecified: Secondary | ICD-10-CM | POA: Diagnosis not present

## 2021-09-10 DIAGNOSIS — Z23 Encounter for immunization: Secondary | ICD-10-CM | POA: Diagnosis not present

## 2021-09-10 DIAGNOSIS — F32A Depression, unspecified: Secondary | ICD-10-CM | POA: Diagnosis not present

## 2021-09-30 DIAGNOSIS — N181 Chronic kidney disease, stage 1: Secondary | ICD-10-CM | POA: Diagnosis not present

## 2021-09-30 DIAGNOSIS — A6000 Herpesviral infection of urogenital system, unspecified: Secondary | ICD-10-CM | POA: Diagnosis not present

## 2021-09-30 DIAGNOSIS — R809 Proteinuria, unspecified: Secondary | ICD-10-CM | POA: Diagnosis not present

## 2021-09-30 DIAGNOSIS — M329 Systemic lupus erythematosus, unspecified: Secondary | ICD-10-CM | POA: Diagnosis not present

## 2021-10-16 ENCOUNTER — Other Ambulatory Visit: Payer: Self-pay

## 2021-10-16 ENCOUNTER — Ambulatory Visit
Admission: EM | Admit: 2021-10-16 | Discharge: 2021-10-16 | Discharge status: Against Medical Advice | Payer: BC Managed Care – PPO

## 2021-10-16 DIAGNOSIS — J101 Influenza due to other identified influenza virus with other respiratory manifestations: Secondary | ICD-10-CM | POA: Diagnosis not present

## 2021-10-16 DIAGNOSIS — R6889 Other general symptoms and signs: Secondary | ICD-10-CM | POA: Diagnosis not present

## 2021-10-16 DIAGNOSIS — R110 Nausea: Secondary | ICD-10-CM | POA: Diagnosis not present

## 2021-10-29 DIAGNOSIS — Z3042 Encounter for surveillance of injectable contraceptive: Secondary | ICD-10-CM | POA: Diagnosis not present

## 2021-11-15 DIAGNOSIS — Z202 Contact with and (suspected) exposure to infections with a predominantly sexual mode of transmission: Secondary | ICD-10-CM | POA: Diagnosis not present

## 2021-11-15 DIAGNOSIS — Z113 Encounter for screening for infections with a predominantly sexual mode of transmission: Secondary | ICD-10-CM | POA: Diagnosis not present

## 2021-11-15 DIAGNOSIS — N898 Other specified noninflammatory disorders of vagina: Secondary | ICD-10-CM | POA: Diagnosis not present

## 2021-12-06 DIAGNOSIS — N390 Urinary tract infection, site not specified: Secondary | ICD-10-CM | POA: Diagnosis not present

## 2021-12-26 DIAGNOSIS — N181 Chronic kidney disease, stage 1: Secondary | ICD-10-CM | POA: Diagnosis not present

## 2021-12-26 DIAGNOSIS — M329 Systemic lupus erythematosus, unspecified: Secondary | ICD-10-CM | POA: Diagnosis not present

## 2021-12-26 DIAGNOSIS — A6000 Herpesviral infection of urogenital system, unspecified: Secondary | ICD-10-CM | POA: Diagnosis not present

## 2021-12-26 DIAGNOSIS — R809 Proteinuria, unspecified: Secondary | ICD-10-CM | POA: Diagnosis not present

## 2021-12-30 DIAGNOSIS — L959 Vasculitis limited to the skin, unspecified: Secondary | ICD-10-CM | POA: Diagnosis not present

## 2021-12-30 DIAGNOSIS — M328 Other forms of systemic lupus erythematosus: Secondary | ICD-10-CM | POA: Diagnosis not present

## 2022-01-03 DIAGNOSIS — I776 Arteritis, unspecified: Secondary | ICD-10-CM | POA: Diagnosis not present

## 2022-01-03 DIAGNOSIS — M3214 Glomerular disease in systemic lupus erythematosus: Secondary | ICD-10-CM | POA: Diagnosis not present

## 2022-01-07 ENCOUNTER — Encounter (HOSPITAL_BASED_OUTPATIENT_CLINIC_OR_DEPARTMENT_OTHER): Payer: Self-pay

## 2022-01-07 ENCOUNTER — Other Ambulatory Visit: Payer: Self-pay

## 2022-01-07 ENCOUNTER — Emergency Department (HOSPITAL_BASED_OUTPATIENT_CLINIC_OR_DEPARTMENT_OTHER)
Admission: EM | Admit: 2022-01-07 | Discharge: 2022-01-07 | Disposition: A | Discharge status: Home / Self Care | Payer: BC Managed Care – PPO | Attending: Emergency Medicine | Admitting: Emergency Medicine

## 2022-01-07 DIAGNOSIS — T380X5A Adverse effect of glucocorticoids and synthetic analogues, initial encounter: Secondary | ICD-10-CM | POA: Insufficient documentation

## 2022-01-07 DIAGNOSIS — Z20822 Contact with and (suspected) exposure to covid-19: Secondary | ICD-10-CM | POA: Insufficient documentation

## 2022-01-07 DIAGNOSIS — L989 Disorder of the skin and subcutaneous tissue, unspecified: Secondary | ICD-10-CM | POA: Insufficient documentation

## 2022-01-07 DIAGNOSIS — R21 Rash and other nonspecific skin eruption: Secondary | ICD-10-CM | POA: Diagnosis not present

## 2022-01-07 DIAGNOSIS — L509 Urticaria, unspecified: Secondary | ICD-10-CM | POA: Diagnosis not present

## 2022-01-07 LAB — RESP PANEL BY RT-PCR (FLU A&B, COVID) ARPGX2
Influenza A by PCR: NEGATIVE
Influenza B by PCR: NEGATIVE
SARS Coronavirus 2 by RT PCR: NEGATIVE

## 2022-01-07 MED ORDER — CETIRIZINE HCL 10 MG PO TBDP: 10.0000 mg | ORAL_TABLET | Freq: Once | ORAL | 0 refills | Status: DC

## 2022-01-07 MED ORDER — CARISOPRODOL 350 MG PO TABS: 350.0000 mg | ORAL_TABLET | Freq: Three times a day (TID) | ORAL | 0 refills | Status: AC | PRN

## 2022-01-07 NOTE — ED Triage Notes (Signed)
Pt reports since Friday she has been having a reaction an infusion she gets for her lupus flare up states that she was given methylprednisolone. Pt reports discoloration the her hands, constipation, has been having back aches, and feeling tired with cold sweats and headaches.  ?

## 2022-01-07 NOTE — ED Notes (Signed)
ED Provider at bedside. 

## 2022-01-07 NOTE — Discharge Instructions (Signed)
I have sent the generic for Zyrtec to your pharmacy.   ? ?I have also sent some Soma however because this is a controlled substance I am unable to send more than 5 pills from the emergency department.  Follow-up with your primary providers about refilling this medication long-term. ? ?Return to the emergency department with any further hives, feeling as though your throat is closing and tongue or lip swelling as these indicate severe allergic reaction. ?

## 2022-01-07 NOTE — ED Provider Notes (Signed)
?[facility] EMERGENCY DEPARTMENT ?Provider Note ? ? ?CSN: [id number] ?Arrival date & time: 01/07/22  1241 ? ?  ? ?History ? ?Chief Complaint  ?Patient presents with  ? Allergic Reaction  ? ? ?Darlene Ruiz is a 26 y.o. female with a past medical history of lupus presenting today due to the complaint of an allergic reaction.  She reports she was having a flare of her lupus and on Friday presented for an IV infusion.  She was given methylprednisolone via infusion and the next day she started to notice urticaria and blistering on her hands.  Denies airway narrowing nausea, vomiting, diarrhea or lip swelling.  No known drug allergies outside of amoxicillin.  Reports that she usually takes Solu-Medrol for lupus flares however has never had methylprednisolone. ? ?Reports she has been taking Tylenol however not Benadryl because it makes her tired.  Has not tried any other antihistamines.  Also requesting refill of her Soma muscle relaxant. ? ?Home Medications ?Prior to Admission medications   ?Medication Sig Start Date End Date Taking? Authorizing Provider  ?carisoprodol (SOMA) 350 MG tablet Take 1 tablet (350 mg total) by mouth 3 (three) times daily as needed for muscle spasms. 01/07/22  Yes Alleah Dearman A, PA-C  ?Cetirizine HCl 10 MG TBDP Take 10 mg by mouth once for 1 dose. 01/07/22 01/07/22 Yes Hansika Leaming A, PA-C  ?azaTHIOprine (IMURAN) 50 MG tablet Take 100 mg by mouth daily. 11/26/16   [provider]  ?desonide (DESOWEN) 0.05 % ointment Apply 1 application topically daily.    [provider]  ?doxycycline (VIBRA-TABS) 100 MG tablet Take 100 mg by mouth 2 (two) times daily. 07/15/21   [provider]  ?ENVARSUS XR 4 MG TB24 Take 1 tablet by mouth daily. 10/08/20   [provider]  ?fluocinonide ointment (LIDEX) [id number] % Apply 1 application topically See admin instructions. Apply 2 times a day to affected areas of lupus. Strict sun protection.  Stop when smooth. 03/25/19    [provider]  ?hydroxychloroquine (PLAQUENIL) 200 MG tablet Take 300 mg by mouth daily.    [provider]  ?lisinopril (PRINIVIL,ZESTRIL) 10 MG tablet Take 10 mg by mouth daily.    [provider]  ?loratadine (CLARITIN) 10 MG tablet Take 10 mg by mouth daily as needed. allergies    [provider]  ?medroxyPROGESTERone (DEPO-PROVERA) 150 MG/ML injection Inject 150 mg into the muscle every 3 (three) months. 11/08/20   [provider]  ?montelukast (SINGULAIR) 10 MG tablet Take 10 mg by mouth daily as needed. allergies    [provider]  ?ondansetron (ZOFRAN) 4 MG tablet Take 1 tablet (4 mg total) by mouth every 6 (six) hours as needed for nausea. 02/01/21   Allie Bossier, MD  ?valACYclovir (VALTREX) 1000 MG tablet Take 1,000 mg by mouth daily as needed (OB).    [provider]  ?   ? ?Allergies    ?Amoxicillin   ? ?Review of Systems   ?Review of Systems ?See HPI ? ?Physical Exam ?Updated Vital Signs ?BP (!) 137/116 (BP Location: Right Arm)   Pulse 71   Temp 98.2 ?F (36.8 ?C) (Oral)   Resp 15   Ht 4\' 8"  (1.422 m)   Wt 51.3 kg   SpO2 100%   BMI 25.33 kg/m?  ?Physical Exam ?Vitals and nursing note reviewed.  ?Constitutional:   ?   General: She is not in acute distress. ?   Appearance: Normal appearance. She is  not ill-appearing.  ?HENT:  ?   Head: Normocephalic and atraumatic.  ?   Mouth/Throat:  ?   Mouth: Mucous membranes are moist.  ?   Pharynx: Oropharynx is clear.  ?   Comments: No airway narrowing, tolerating secretions ?Eyes:  ?   General: No scleral icterus. ?   Conjunctiva/sclera: Conjunctivae normal.  ?Pulmonary:  ?   Effort: Pulmonary effort is normal. No respiratory distress.  ?Skin: ?   Findings: No rash.  ?   Comments: Symptoms scabbed lesions on her bilateral palms.  This appears chronic  ?Neurological:  ?   Mental Status: She is alert.  ?Psychiatric:     ?   Mood and Affect: Mood normal.  ? ? ?ED Results / Procedures / Treatments    ?Labs ?(all labs ordered are listed, but only abnormal results are displayed) ?Labs Reviewed  ?RESP PANEL BY RT-PCR (FLU A&B, COVID) [id number]  ? ? ?EKG ?None ? ?Radiology ?No results found. ? ?Procedures ?Procedures  ? ? ?Medications Ordered in ED ?Medications - No data to display ? ?ED Course/ Medical Decision Making/ A&P ?  ?                        ?Medical Decision Making ?Risk ?OTC drugs. ?Prescription drug management. ? ? ?75-year-old presenting with a concern for an allergic reaction from methylprednisolone infusion on Friday.  Denies any signs of anaphylaxis.  Says that she feels "weird" and that she is experiencing a flat mood affect. ? ?I was able to review the patient's outside medical records and see that she received methylprednisolone treatment.  She contacted her provider after this to notify them of the problem.  She waited throughout the weekend however decided to come to the emergency department because she was feeling weird.  Reports that the rash on her hands has resolved.  Per chart review, she has an extensive history of these pulmonary lesions and has been diagnosed with vasculitis in the setting of her lupus. ? ?Physical exam: Patient with clear airway, tolerating secretions and no wheezing. ? ?Treatment: Acute allergic reaction treatment is not needed at this time. ? ?Disposition: We discussed the different classes and different medications called antihistamines.  She voiced understanding.  She will try Zyrtec and I have sent cetirizine to her pharmacy.  She understands she can also buy this over-the-counter.  She was given 5 pills of her muscle relaxant, Soma, however because this is a controlled substance I notified her that I will not refill anymore. ? ?Return precautions discussed and she voiced understanding.  I suggested that she follow-up with her primary care provider about her mood changes as this is not an emergent matter. ? ?Final Clinical Impression(s) / ED Diagnoses ?Final  diagnoses:  ?Rash  ? ? ?Rx / DC Orders ?ED Discharge Orders   ? ?      Ordered  ?  Cetirizine HCl 10 MG TBDP   Once       ? 01/07/22 1310  ?  carisoprodol (SOMA) 350 MG tablet  3 times daily PRN       ? 01/07/22 1310  ? ?  ?  ? ?  ? ?Results and diagnoses were explained to the patient. Return precautions discussed in full. Patient had no additional questions and expressed complete understanding. ? ? ?This chart was dictated using voice recognition software.  Despite best efforts to proofread,  errors can occur which can change the documentation meaning.  ?  ?  Rhae Hammock, PA-C ?01/07/22 1316 ? ?  ?Lennice Sites, DO ?01/07/22 1325 ? ?

## 2022-01-15 DIAGNOSIS — F4323 Adjustment disorder with mixed anxiety and depressed mood: Secondary | ICD-10-CM | POA: Diagnosis not present

## 2022-01-22 DIAGNOSIS — Z3042 Encounter for surveillance of injectable contraceptive: Secondary | ICD-10-CM | POA: Diagnosis not present

## 2022-01-27 DIAGNOSIS — F4323 Adjustment disorder with mixed anxiety and depressed mood: Secondary | ICD-10-CM | POA: Diagnosis not present

## 2022-02-03 DIAGNOSIS — M329 Systemic lupus erythematosus, unspecified: Secondary | ICD-10-CM | POA: Diagnosis not present

## 2022-02-03 DIAGNOSIS — R059 Cough, unspecified: Secondary | ICD-10-CM | POA: Diagnosis not present

## 2022-02-12 DIAGNOSIS — I776 Arteritis, unspecified: Secondary | ICD-10-CM | POA: Diagnosis not present

## 2022-02-18 DIAGNOSIS — F4323 Adjustment disorder with mixed anxiety and depressed mood: Secondary | ICD-10-CM | POA: Diagnosis not present

## 2022-03-11 DIAGNOSIS — M328 Other forms of systemic lupus erythematosus: Secondary | ICD-10-CM | POA: Diagnosis not present

## 2022-03-11 DIAGNOSIS — I10 Essential (primary) hypertension: Secondary | ICD-10-CM | POA: Diagnosis not present

## 2022-03-11 DIAGNOSIS — I776 Arteritis, unspecified: Secondary | ICD-10-CM | POA: Diagnosis not present

## 2022-03-11 DIAGNOSIS — Z113 Encounter for screening for infections with a predominantly sexual mode of transmission: Secondary | ICD-10-CM | POA: Diagnosis not present

## 2022-03-11 DIAGNOSIS — Z79899 Other long term (current) drug therapy: Secondary | ICD-10-CM | POA: Diagnosis not present

## 2022-03-11 DIAGNOSIS — R42 Dizziness and giddiness: Secondary | ICD-10-CM | POA: Diagnosis not present

## 2022-03-11 DIAGNOSIS — F321 Major depressive disorder, single episode, moderate: Secondary | ICD-10-CM | POA: Diagnosis not present

## 2022-03-11 DIAGNOSIS — D801 Nonfamilial hypogammaglobulinemia: Secondary | ICD-10-CM | POA: Diagnosis not present

## 2022-03-11 DIAGNOSIS — G4701 Insomnia due to medical condition: Secondary | ICD-10-CM | POA: Diagnosis not present

## 2022-03-11 DIAGNOSIS — M3219 Other organ or system involvement in systemic lupus erythematosus: Secondary | ICD-10-CM | POA: Diagnosis not present

## 2022-03-11 DIAGNOSIS — I150 Renovascular hypertension: Secondary | ICD-10-CM | POA: Diagnosis not present

## 2022-03-11 DIAGNOSIS — L959 Vasculitis limited to the skin, unspecified: Secondary | ICD-10-CM | POA: Diagnosis not present

## 2022-03-11 DIAGNOSIS — G8929 Other chronic pain: Secondary | ICD-10-CM | POA: Diagnosis not present

## 2022-03-11 DIAGNOSIS — L930 Discoid lupus erythematosus: Secondary | ICD-10-CM | POA: Diagnosis not present

## 2022-03-15 DIAGNOSIS — M3214 Glomerular disease in systemic lupus erythematosus: Secondary | ICD-10-CM | POA: Diagnosis not present

## 2022-03-15 DIAGNOSIS — I776 Arteritis, unspecified: Secondary | ICD-10-CM | POA: Diagnosis not present

## 2022-03-22 DIAGNOSIS — M3214 Glomerular disease in systemic lupus erythematosus: Secondary | ICD-10-CM | POA: Diagnosis not present

## 2022-03-22 DIAGNOSIS — I776 Arteritis, unspecified: Secondary | ICD-10-CM | POA: Diagnosis not present

## 2022-03-27 DIAGNOSIS — M328 Other forms of systemic lupus erythematosus: Secondary | ICD-10-CM | POA: Diagnosis not present

## 2022-03-27 DIAGNOSIS — L930 Discoid lupus erythematosus: Secondary | ICD-10-CM | POA: Diagnosis not present

## 2022-03-27 DIAGNOSIS — L959 Vasculitis limited to the skin, unspecified: Secondary | ICD-10-CM | POA: Diagnosis not present

## 2022-03-27 DIAGNOSIS — M3214 Glomerular disease in systemic lupus erythematosus: Secondary | ICD-10-CM | POA: Diagnosis not present

## 2022-04-05 DIAGNOSIS — I776 Arteritis, unspecified: Secondary | ICD-10-CM | POA: Diagnosis not present

## 2022-04-05 DIAGNOSIS — Z113 Encounter for screening for infections with a predominantly sexual mode of transmission: Secondary | ICD-10-CM | POA: Diagnosis not present

## 2022-04-05 DIAGNOSIS — M3214 Glomerular disease in systemic lupus erythematosus: Secondary | ICD-10-CM | POA: Diagnosis not present

## 2022-04-09 DIAGNOSIS — Z3042 Encounter for surveillance of injectable contraceptive: Secondary | ICD-10-CM | POA: Diagnosis not present

## 2022-04-11 DIAGNOSIS — R252 Cramp and spasm: Secondary | ICD-10-CM | POA: Diagnosis not present

## 2022-04-11 DIAGNOSIS — R5383 Other fatigue: Secondary | ICD-10-CM | POA: Diagnosis not present

## 2022-04-11 DIAGNOSIS — E559 Vitamin D deficiency, unspecified: Secondary | ICD-10-CM | POA: Diagnosis not present

## 2022-04-22 DIAGNOSIS — Z113 Encounter for screening for infections with a predominantly sexual mode of transmission: Secondary | ICD-10-CM | POA: Diagnosis not present

## 2022-04-22 DIAGNOSIS — N898 Other specified noninflammatory disorders of vagina: Secondary | ICD-10-CM | POA: Diagnosis not present

## 2022-04-27 DIAGNOSIS — F4323 Adjustment disorder with mixed anxiety and depressed mood: Secondary | ICD-10-CM | POA: Diagnosis not present

## 2022-05-10 DIAGNOSIS — M3214 Glomerular disease in systemic lupus erythematosus: Secondary | ICD-10-CM | POA: Diagnosis not present

## 2022-05-10 DIAGNOSIS — I776 Arteritis, unspecified: Secondary | ICD-10-CM | POA: Diagnosis not present

## 2022-05-11 DIAGNOSIS — F4323 Adjustment disorder with mixed anxiety and depressed mood: Secondary | ICD-10-CM | POA: Diagnosis not present

## 2022-05-13 DIAGNOSIS — R768 Other specified abnormal immunological findings in serum: Secondary | ICD-10-CM | POA: Diagnosis not present

## 2022-05-13 DIAGNOSIS — R110 Nausea: Secondary | ICD-10-CM | POA: Diagnosis not present

## 2022-05-13 DIAGNOSIS — D841 Defects in the complement system: Secondary | ICD-10-CM | POA: Diagnosis not present

## 2022-05-13 DIAGNOSIS — Z79899 Other long term (current) drug therapy: Secondary | ICD-10-CM | POA: Diagnosis not present

## 2022-05-13 DIAGNOSIS — M328 Other forms of systemic lupus erythematosus: Secondary | ICD-10-CM | POA: Diagnosis not present

## 2022-05-13 DIAGNOSIS — L959 Vasculitis limited to the skin, unspecified: Secondary | ICD-10-CM | POA: Diagnosis not present

## 2022-05-21 DIAGNOSIS — Z79899 Other long term (current) drug therapy: Secondary | ICD-10-CM | POA: Diagnosis not present

## 2022-06-01 DIAGNOSIS — F4323 Adjustment disorder with mixed anxiety and depressed mood: Secondary | ICD-10-CM | POA: Diagnosis not present

## 2022-06-11 DIAGNOSIS — M3214 Glomerular disease in systemic lupus erythematosus: Secondary | ICD-10-CM | POA: Diagnosis not present

## 2022-06-11 DIAGNOSIS — M3219 Other organ or system involvement in systemic lupus erythematosus: Secondary | ICD-10-CM | POA: Diagnosis not present

## 2022-06-11 DIAGNOSIS — L959 Vasculitis limited to the skin, unspecified: Secondary | ICD-10-CM | POA: Diagnosis not present

## 2022-07-09 DIAGNOSIS — L930 Discoid lupus erythematosus: Secondary | ICD-10-CM | POA: Diagnosis not present

## 2022-07-09 DIAGNOSIS — Z3042 Encounter for surveillance of injectable contraceptive: Secondary | ICD-10-CM | POA: Diagnosis not present

## 2022-07-15 DIAGNOSIS — R768 Other specified abnormal immunological findings in serum: Secondary | ICD-10-CM | POA: Diagnosis not present

## 2022-07-15 DIAGNOSIS — L959 Vasculitis limited to the skin, unspecified: Secondary | ICD-10-CM | POA: Diagnosis not present

## 2022-07-15 DIAGNOSIS — Z7189 Other specified counseling: Secondary | ICD-10-CM | POA: Diagnosis not present

## 2022-07-15 DIAGNOSIS — U071 COVID-19: Secondary | ICD-10-CM | POA: Diagnosis not present

## 2022-07-15 DIAGNOSIS — M328 Other forms of systemic lupus erythematosus: Secondary | ICD-10-CM | POA: Diagnosis not present

## 2022-07-18 DIAGNOSIS — I776 Arteritis, unspecified: Secondary | ICD-10-CM | POA: Diagnosis not present

## 2022-07-18 DIAGNOSIS — Z79899 Other long term (current) drug therapy: Secondary | ICD-10-CM | POA: Diagnosis not present

## 2022-07-18 DIAGNOSIS — M328 Other forms of systemic lupus erythematosus: Secondary | ICD-10-CM | POA: Diagnosis not present

## 2022-07-22 ENCOUNTER — Other Ambulatory Visit: Payer: Self-pay

## 2022-07-22 DIAGNOSIS — R509 Fever, unspecified: Secondary | ICD-10-CM | POA: Diagnosis not present

## 2022-07-22 DIAGNOSIS — N390 Urinary tract infection, site not specified: Secondary | ICD-10-CM | POA: Insufficient documentation

## 2022-07-22 DIAGNOSIS — Z79899 Other long term (current) drug therapy: Secondary | ICD-10-CM | POA: Diagnosis not present

## 2022-07-22 NOTE — ED Triage Notes (Signed)
POV, pt sts that she had covid recently but has been testing neg since Sunday night. Sts still has cough, congestion, and chills. Tylenol taken approx 2130. Sts that she's aching in legs, back and stomach is cramping. Alert and oriented x 4. Ambulatory to triage.

## 2022-07-23 ENCOUNTER — Emergency Department (HOSPITAL_BASED_OUTPATIENT_CLINIC_OR_DEPARTMENT_OTHER)
Admission: EM | Admit: 2022-07-23 | Discharge: 2022-07-23 | Disposition: A | Discharge status: Home / Self Care | Payer: BC Managed Care – PPO | Attending: Emergency Medicine | Admitting: Emergency Medicine

## 2022-07-23 DIAGNOSIS — N390 Urinary tract infection, site not specified: Secondary | ICD-10-CM

## 2022-07-23 LAB — URINALYSIS, ROUTINE W REFLEX MICROSCOPIC
Bilirubin Urine: NEGATIVE
Glucose, UA: NEGATIVE mg/dL
Hgb urine dipstick: NEGATIVE
Ketones, ur: NEGATIVE mg/dL
Nitrite: NEGATIVE
Specific Gravity, Urine: 1.023 (ref 1.005–1.030)
WBC, UA: >50 WBC/hpf — ABNORMAL HIGH (ref 0–5)
pH: 6 (ref 5.0–8.0)

## 2022-07-23 LAB — PREGNANCY, URINE: Preg Test, Ur: NEGATIVE

## 2022-07-23 MED ORDER — ACETAMINOPHEN 500 MG PO TABS: 1000.0000 mg | ORAL_TABLET | Freq: Four times a day (QID) | ORAL | Status: DC | PRN

## 2022-07-23 NOTE — ED Notes (Signed)
Pt verbalizes understanding of discharge instructions. Opportunity for questioning and answers were provided. Pt discharged from ED to home with family.    

## 2022-07-23 NOTE — Discharge Instructions (Signed)
You were seen today for ongoing chills.  This could be related to recent COVID diagnosis.  However, it does appear that you also likely have a resolving UTI.  Continue your antibiotics at home.

## 2022-07-23 NOTE — ED Provider Notes (Signed)
[facility] EMERGENCY DEPT Provider Note   CSN: [id number] Arrival date & time: 07/22/22  2222     History  Chief Complaint  Patient presents with   Fever   Generalized Body Aches    Darlene Ruiz is a 26 y.o. female.  HPI     This is a 26 year old female with a history of lupus who presents with chills.  Patient reports that she was recently diagnosed with COVID at the beginning of September.  She finished a course of treatment and has since tested negative.  She has had some persistent cough and congestion.  She also reports that recently she has some dysuria and began treatment for UTI approximately 2 days ago.  She has had some on and off leg cramping and stomach cramping.  Denies overt fevers but did not take her temperature at home.  Denies nausea, vomiting, change in bowel movements.  Home Medications Prior to Admission medications   Medication Sig Start Date End Date Taking? Authorizing Provider  azaTHIOprine (IMURAN) 50 MG tablet Take 100 mg by mouth daily. 11/26/16   [provider]  carisoprodol (SOMA) 350 MG tablet Take 1 tablet (350 mg total) by mouth 3 (three) times daily as needed for muscle spasms. 01/07/22   Redwine, Madison A, PA-C  Cetirizine HCl 10 MG TBDP Take 10 mg by mouth once for 1 dose. 01/07/22 01/07/22  Redwine, Madison A, PA-C  desonide (DESOWEN) 0.05 % ointment Apply 1 application topically daily.    [provider]  doxycycline (VIBRA-TABS) 100 MG tablet Take 100 mg by mouth 2 (two) times daily. 07/15/21   [provider]  ENVARSUS XR 4 MG TB24 Take 1 tablet by mouth daily. 10/08/20   [provider]  fluocinonide ointment (LIDEX) 2.42 % Apply 1 application topically See admin instructions. Apply 2 times a day to affected areas of lupus. Strict sun protection.  Stop when smooth. 03/25/19   [provider]  hydroxychloroquine (PLAQUENIL) 200 MG tablet Take 300 mg by mouth daily.    [provider]  lisinopril (PRINIVIL,ZESTRIL) 10 MG tablet Take 10 mg by mouth daily.    [provider]  loratadine (CLARITIN) 10 MG tablet Take 10 mg by mouth daily as needed. allergies    [provider]  medroxyPROGESTERone (DEPO-PROVERA) 150 MG/ML injection Inject 150 mg into the muscle every 3 (three) months. 11/08/20   [provider]  montelukast (SINGULAIR) 10 MG tablet Take 10 mg by mouth daily as needed. allergies    [provider]  ondansetron (ZOFRAN) 4 MG tablet Take 1 tablet (4 mg total) by mouth every 6 (six) hours as needed for nausea. 02/01/21   Allie Bossier, MD  valACYclovir (VALTREX) 1000 MG tablet Take 1,000 mg by mouth daily as needed (OB).    [provider]      Allergies    Amoxicillin    Review of Systems   Review of Systems  Constitutional:  Positive for chills. Negative for fever.  HENT:  Positive for congestion.   Respiratory:  Positive for cough. Negative for shortness of breath.   Cardiovascular:  Negative for chest pain.  All other systems reviewed and are negative.   Physical Exam Updated Vital Signs BP 125/67   Pulse 100   Temp 98.8 F (37.1 C)   Resp 18   Wt 56.2 kg   SpO2 100%   BMI 27.80 kg/m  Physical Exam Vitals and nursing note reviewed.  Constitutional:  Appearance: She is well-developed. She is not ill-appearing.  HENT:     Head: Normocephalic and atraumatic.  Eyes:     Pupils: Pupils are equal, round, and reactive to light.  Cardiovascular:     Rate and Rhythm: Normal rate and regular rhythm.     Heart sounds: Normal heart sounds.  Pulmonary:     Effort: Pulmonary effort is normal. No respiratory distress.     Breath sounds: No wheezing.  Abdominal:     General: Bowel sounds are normal.     Palpations: Abdomen is soft.     Tenderness: There is no abdominal tenderness.  Genitourinary:    Comments: Inguinal lymphadenopathy Musculoskeletal:     Cervical back: Neck supple.   Skin:    General: Skin is warm and dry.  Neurological:     Mental Status: She is alert and oriented to person, place, and time.  Psychiatric:        Mood and Affect: Mood normal.     ED Results / Procedures / Treatments   Labs (all labs ordered are listed, but only abnormal results are displayed) Labs Reviewed  URINALYSIS, ROUTINE W REFLEX MICROSCOPIC - Abnormal; Notable for the following components:      Result Value   Protein, ur TRACE (*)    Leukocytes,Ua MODERATE (*)    WBC, UA >50 (*)    Bacteria, UA RARE (*)    All other components within normal limits  PREGNANCY, URINE    EKG None  Radiology No results found.  Procedures Procedures    Medications Ordered in ED Medications  acetaminophen (TYLENOL) tablet 1,000 mg (has no administration in time range)    ED Course/ Medical Decision Making/ A&P                           Medical Decision Making Amount and/or Complexity of Data Reviewed Labs: ordered.  Risk OTC drugs.   This patient presents to the ED for concern of chills, congestion, this involves an extensive number of treatment options, and is a complaint that carries with it a high risk of complications and morbidity.  I considered the following differential and admission for this acute, potentially life threatening condition.  The differential diagnosis includes ongoing symptoms related to recent COVID diagnosis, new viral infection such as influenza, bacterial infection such as urinary tract infection or pneumonia  MDM:    This is a 26 year old female who presents with chills.  No documented fevers at home.  Also reports ongoing congestion and cough and recent urinary symptoms.  She is nontoxic and vital signs are reassuring.  She is afebrile.  O2 saturations 100%.  Pulmonary exam is reassuring.  She states that cough has been ongoing since COVID diagnosis.  Given that she is afebrile and without hypoxia, would have low suspicion for pneumonia.   Urinalysis obtained.  It does have some white cells and bacteria.  Reports that she currently is taking antibiotics.  Given that she continue.  Urine pregnancy is negative.  She is overall clinically well-appearing.  Discussed with her that her symptoms may be related to current UTI versus ongoing sequelae of COVID-19.  Recommend ongoing supportive measures. (Labs, imaging, consults)  Labs: I Ordered, and personally interpreted labs.  The pertinent results include: Urinalysis, urine pregnancy  Imaging Studies ordered: I ordered imaging studies including none I independently visualized and interpreted imaging. I agree with the radiologist interpretation  Additional history obtained from chart review.  External records from outside source obtained and reviewed including prior evaluation  Cardiac Monitoring: The patient was maintained on a cardiac monitor.  I personally viewed and interpreted the cardiac monitored which showed an underlying rhythm of: Sinus rhythm  Reevaluation: After the interventions noted above, I reevaluated the patient and found that they have :improved  Social Determinants of Health: Lives independently, lupus  Disposition: Discharge  Co morbidities that complicate the patient evaluation  Past Medical History:  Diagnosis Date   Hypertension    Lupus (HCC)    Renal disorder      Medicines Meds ordered this encounter  Medications   acetaminophen (TYLENOL) tablet 1,000 mg    I have reviewed the patients home medicines and have made adjustments as needed  Problem List / ED Course: Problem List Items Addressed This Visit   None Visit Diagnoses     Urinary tract infection without hematuria, site unspecified    -  Primary                   Final Clinical Impression(s) / ED Diagnoses Final diagnoses:  Urinary tract infection without hematuria, site unspecified    Rx / DC Orders ED Discharge Orders     None         Shon Baton, MD 07/23/22 [phone]

## 2022-07-25 DIAGNOSIS — H1031 Unspecified acute conjunctivitis, right eye: Secondary | ICD-10-CM | POA: Diagnosis not present

## 2022-07-26 DIAGNOSIS — M3219 Other organ or system involvement in systemic lupus erythematosus: Secondary | ICD-10-CM | POA: Diagnosis not present

## 2022-07-26 DIAGNOSIS — L959 Vasculitis limited to the skin, unspecified: Secondary | ICD-10-CM | POA: Diagnosis not present

## 2022-07-29 DIAGNOSIS — H109 Unspecified conjunctivitis: Secondary | ICD-10-CM | POA: Diagnosis not present

## 2022-08-17 DIAGNOSIS — F4323 Adjustment disorder with mixed anxiety and depressed mood: Secondary | ICD-10-CM | POA: Diagnosis not present

## 2022-08-23 DIAGNOSIS — M3214 Glomerular disease in systemic lupus erythematosus: Secondary | ICD-10-CM | POA: Diagnosis not present

## 2022-08-23 DIAGNOSIS — M329 Systemic lupus erythematosus, unspecified: Secondary | ICD-10-CM | POA: Diagnosis not present

## 2022-08-31 DIAGNOSIS — F4323 Adjustment disorder with mixed anxiety and depressed mood: Secondary | ICD-10-CM | POA: Diagnosis not present

## 2022-09-08 DIAGNOSIS — H109 Unspecified conjunctivitis: Secondary | ICD-10-CM | POA: Diagnosis not present

## 2022-09-08 DIAGNOSIS — Z23 Encounter for immunization: Secondary | ICD-10-CM | POA: Diagnosis not present

## 2022-09-10 DIAGNOSIS — Z79899 Other long term (current) drug therapy: Secondary | ICD-10-CM | POA: Diagnosis not present

## 2022-09-15 DIAGNOSIS — R0982 Postnasal drip: Secondary | ICD-10-CM | POA: Diagnosis not present

## 2022-09-15 DIAGNOSIS — R519 Headache, unspecified: Secondary | ICD-10-CM | POA: Diagnosis not present

## 2022-09-15 DIAGNOSIS — J029 Acute pharyngitis, unspecified: Secondary | ICD-10-CM | POA: Diagnosis not present

## 2022-09-15 DIAGNOSIS — Z20822 Contact with and (suspected) exposure to covid-19: Secondary | ICD-10-CM | POA: Diagnosis not present

## 2022-09-15 DIAGNOSIS — J069 Acute upper respiratory infection, unspecified: Secondary | ICD-10-CM | POA: Diagnosis not present

## 2022-09-15 DIAGNOSIS — R059 Cough, unspecified: Secondary | ICD-10-CM | POA: Diagnosis not present

## 2022-09-20 DIAGNOSIS — M3214 Glomerular disease in systemic lupus erythematosus: Secondary | ICD-10-CM | POA: Diagnosis not present

## 2022-10-01 DIAGNOSIS — M329 Systemic lupus erythematosus, unspecified: Secondary | ICD-10-CM | POA: Diagnosis not present

## 2022-10-01 DIAGNOSIS — Z8739 Personal history of other diseases of the musculoskeletal system and connective tissue: Secondary | ICD-10-CM | POA: Diagnosis not present

## 2022-10-01 DIAGNOSIS — A6000 Herpesviral infection of urogenital system, unspecified: Secondary | ICD-10-CM | POA: Diagnosis not present

## 2022-10-01 DIAGNOSIS — N181 Chronic kidney disease, stage 1: Secondary | ICD-10-CM | POA: Diagnosis not present

## 2022-10-01 DIAGNOSIS — R809 Proteinuria, unspecified: Secondary | ICD-10-CM | POA: Diagnosis not present

## 2022-10-07 DIAGNOSIS — Z130 Encounter for screening for diseases of the blood and blood-forming organs and certain disorders involving the immune mechanism: Secondary | ICD-10-CM | POA: Diagnosis not present

## 2022-10-07 DIAGNOSIS — R87610 Atypical squamous cells of undetermined significance on cytologic smear of cervix (ASC-US): Secondary | ICD-10-CM | POA: Diagnosis not present

## 2022-10-07 DIAGNOSIS — N898 Other specified noninflammatory disorders of vagina: Secondary | ICD-10-CM | POA: Diagnosis not present

## 2022-10-07 DIAGNOSIS — Z113 Encounter for screening for infections with a predominantly sexual mode of transmission: Secondary | ICD-10-CM | POA: Diagnosis not present

## 2022-10-07 DIAGNOSIS — Z01419 Encounter for gynecological examination (general) (routine) without abnormal findings: Secondary | ICD-10-CM | POA: Diagnosis not present

## 2022-10-07 DIAGNOSIS — Z3042 Encounter for surveillance of injectable contraceptive: Secondary | ICD-10-CM | POA: Diagnosis not present

## 2022-10-07 DIAGNOSIS — R87810 Cervical high risk human papillomavirus (HPV) DNA test positive: Secondary | ICD-10-CM | POA: Diagnosis not present

## 2022-10-18 DIAGNOSIS — M3214 Glomerular disease in systemic lupus erythematosus: Secondary | ICD-10-CM | POA: Diagnosis not present

## 2022-11-29 ENCOUNTER — Emergency Department (HOSPITAL_BASED_OUTPATIENT_CLINIC_OR_DEPARTMENT_OTHER): Payer: BC Managed Care – PPO

## 2022-11-29 ENCOUNTER — Emergency Department (HOSPITAL_BASED_OUTPATIENT_CLINIC_OR_DEPARTMENT_OTHER)
Admission: EM | Admit: 2022-11-29 | Discharge: 2022-11-29 | Disposition: A | Discharge status: Home / Self Care | Payer: BC Managed Care – PPO | Attending: Emergency Medicine | Admitting: Emergency Medicine

## 2022-11-29 ENCOUNTER — Emergency Department (HOSPITAL_BASED_OUTPATIENT_CLINIC_OR_DEPARTMENT_OTHER): Payer: BC Managed Care – PPO | Admitting: Radiology

## 2022-11-29 ENCOUNTER — Other Ambulatory Visit: Payer: Self-pay

## 2022-11-29 DIAGNOSIS — J101 Influenza due to other identified influenza virus with other respiratory manifestations: Secondary | ICD-10-CM | POA: Diagnosis not present

## 2022-11-29 DIAGNOSIS — E876 Hypokalemia: Secondary | ICD-10-CM | POA: Diagnosis not present

## 2022-11-29 DIAGNOSIS — R000 Tachycardia, unspecified: Secondary | ICD-10-CM | POA: Diagnosis not present

## 2022-11-29 DIAGNOSIS — R8289 Other abnormal findings on cytological and histological examination of urine: Secondary | ICD-10-CM | POA: Diagnosis not present

## 2022-11-29 DIAGNOSIS — R0602 Shortness of breath: Secondary | ICD-10-CM | POA: Diagnosis present

## 2022-11-29 DIAGNOSIS — R7309 Other abnormal glucose: Secondary | ICD-10-CM | POA: Insufficient documentation

## 2022-11-29 DIAGNOSIS — J111 Influenza due to unidentified influenza virus with other respiratory manifestations: Secondary | ICD-10-CM

## 2022-11-29 DIAGNOSIS — Z79899 Other long term (current) drug therapy: Secondary | ICD-10-CM | POA: Insufficient documentation

## 2022-11-29 LAB — URINALYSIS, ROUTINE W REFLEX MICROSCOPIC
Bacteria, UA: NONE SEEN
Bilirubin Urine: NEGATIVE
Glucose, UA: NEGATIVE mg/dL
Hgb urine dipstick: NEGATIVE
Ketones, ur: 15 mg/dL — AB
Leukocytes,Ua: NEGATIVE
Nitrite: NEGATIVE
Protein, ur: 100 mg/dL — AB
Specific Gravity, Urine: 1.028 (ref 1.005–1.030)
pH: 6 (ref 5.0–8.0)

## 2022-11-29 LAB — CBC WITH DIFFERENTIAL/PLATELET
Abs Immature Granulocytes: 0.03 10*3/uL (ref 0.00–0.07)
Basophils Absolute: 0 10*3/uL (ref 0.0–0.1)
Basophils Relative: 0 %
Eosinophils Absolute: 0 10*3/uL (ref 0.0–0.5)
Eosinophils Relative: 0 %
HCT: 39.3 % (ref 36.0–46.0)
Hemoglobin: 13.3 g/dL (ref 12.0–15.0)
Immature Granulocytes: 0 %
Lymphocytes Relative: 27 %
Lymphs Abs: 3 10*3/uL (ref 0.7–4.0)
MCH: 29.2 pg (ref 26.0–34.0)
MCHC: 33.8 g/dL (ref 30.0–36.0)
MCV: 86.4 fL (ref 80.0–100.0)
Monocytes Absolute: 0.6 10*3/uL (ref 0.1–1.0)
Monocytes Relative: 5 %
Neutro Abs: 7.4 10*3/uL (ref 1.7–7.7)
Neutrophils Relative %: 68 %
Platelets: 222 10*3/uL (ref 150–400)
RBC: 4.55 MIL/uL (ref 3.87–5.11)
RDW: 14.2 % (ref 11.5–15.5)
WBC: 11 10*3/uL — ABNORMAL HIGH (ref 4.0–10.5)
nRBC: 0 % (ref 0.0–0.2)

## 2022-11-29 LAB — TROPONIN I (HIGH SENSITIVITY)
Troponin I (High Sensitivity): 3 ng/L (ref <18)
Troponin I (High Sensitivity): 3 ng/L (ref <18)

## 2022-11-29 LAB — LACTIC ACID, PLASMA
Lactic Acid, Venous: 1.3 mmol/L (ref 0.5–1.9)
Lactic Acid, Venous: 1 mmol/L (ref 0.5–1.9)

## 2022-11-29 LAB — COMPREHENSIVE METABOLIC PANEL
ALT: 12 U/L (ref 0–44)
AST: 17 U/L (ref 15–41)
Albumin: 4.2 g/dL (ref 3.5–5.0)
Alkaline Phosphatase: 77 U/L (ref 38–126)
Anion gap: 12 (ref 5–15)
BUN: 12 mg/dL (ref 6–20)
CO2: 22 mmol/L (ref 22–32)
Calcium: 9.2 mg/dL (ref 8.9–10.3)
Chloride: 102 mmol/L (ref 98–111)
Creatinine, Ser: 0.98 mg/dL (ref 0.44–1.00)
GFR, Estimated: >60 mL/min (ref >60)
Glucose, Bld: 156 mg/dL — ABNORMAL HIGH (ref 70–99)
Potassium: 3.1 mmol/L — ABNORMAL LOW (ref 3.5–5.1)
Sodium: 136 mmol/L (ref 135–145)
Total Bilirubin: 0.5 mg/dL (ref 0.3–1.2)
Total Protein: 7.4 g/dL (ref 6.5–8.1)

## 2022-11-29 LAB — HCG, QUANTITATIVE, PREGNANCY: hCG, Beta Chain, Quant, S: <1 m[IU]/mL (ref <5)

## 2022-11-29 MED ORDER — PREDNISONE 20 MG PO TABS: 40.0000 mg | ORAL_TABLET | Freq: Every day | ORAL | 0 refills | Status: AC

## 2022-11-29 MED ORDER — LACTATED RINGERS IV BOLUS
1000.0000 mL | Freq: Once | INTRAVENOUS | Status: AC
  Administered 2022-11-29: 1000 mL via INTRAVENOUS

## 2022-11-29 MED ORDER — POTASSIUM CHLORIDE CRYS ER 20 MEQ PO TBCR
40.0000 meq | EXTENDED_RELEASE_TABLET | Freq: Once | ORAL | Status: AC
  Administered 2022-11-29: 40 meq via ORAL
  Filled 2022-11-29: qty 2

## 2022-11-29 MED ORDER — LACTATED RINGERS IV BOLUS
500.0000 mL | Freq: Once | INTRAVENOUS | Status: AC
  Administered 2022-11-29: 500 mL via INTRAVENOUS

## 2022-11-29 MED ORDER — METHYLPREDNISOLONE SODIUM SUCC 125 MG IJ SOLR
125.0000 mg | Freq: Once | INTRAMUSCULAR | Status: AC
  Administered 2022-11-29: 125 mg via INTRAVENOUS
  Filled 2022-11-29: qty 2

## 2022-11-29 MED ORDER — IOHEXOL 350 MG/ML SOLN
100.0000 mL | Freq: Once | INTRAVENOUS | Status: AC | PRN
  Administered 2022-11-29: 100 mL via INTRAVENOUS

## 2022-11-29 MED ORDER — IPRATROPIUM-ALBUTEROL 0.5-2.5 (3) MG/3ML IN SOLN
3.0000 mL | RESPIRATORY_TRACT | Status: AC
  Administered 2022-11-29: 3 mL via RESPIRATORY_TRACT
  Filled 2022-11-29: qty 3

## 2022-11-29 MED ORDER — ALBUTEROL SULFATE HFA 108 (90 BASE) MCG/ACT IN AERS
2.0000 | INHALATION_SPRAY | RESPIRATORY_TRACT | Status: DC | PRN
  Administered 2022-11-29: 2 via RESPIRATORY_TRACT
  Filled 2022-11-29: qty 6.7

## 2022-11-29 MED ORDER — ACETAMINOPHEN 325 MG PO TABS
650.0000 mg | ORAL_TABLET | Freq: Once | ORAL | Status: AC
  Administered 2022-11-29: 650 mg via ORAL
  Filled 2022-11-29: qty 2

## 2022-11-29 NOTE — ED Notes (Signed)
Patient saturation @ rest  while on Room Air=98%  Patient saturation on Room Air while ambulating= 96%  Patient maintained vitals and appears in no distress post walking

## 2022-11-29 NOTE — ED Notes (Signed)
Patient educated by the RT on using the Albuterol MDI with the spacer.

## 2022-11-29 NOTE — ED Provider Notes (Signed)
[facility] Provider Note   CSN: [id number] Arrival date & time: 11/29/22  1025     History {Add pertinent medical, surgical, social history, OB history to HPI:1} Chief Complaint  Patient presents with  . Shortness of Breath  . Pain Management    Darlene Ruiz is a 27 y.o. female.   Shortness of Breath      Home Medications Prior to Admission medications   Medication Sig Start Date End Date Taking? Authorizing Provider  azaTHIOprine (IMURAN) 50 MG tablet Take 100 mg by mouth daily. 11/26/16   [provider]  carisoprodol (SOMA) 350 MG tablet Take 1 tablet (350 mg total) by mouth 3 (three) times daily as needed for muscle spasms. 01/07/22   Redwine, Madison A, PA-C  Cetirizine HCl 10 MG TBDP Take 10 mg by mouth once for 1 dose. 01/07/22 01/07/22  Redwine, Madison A, PA-C  desonide (DESOWEN) 0.05 % ointment Apply 1 application topically daily.    [provider]  doxycycline (VIBRA-TABS) 100 MG tablet Take 100 mg by mouth 2 (two) times daily. 07/15/21   [provider]  ENVARSUS XR 4 MG TB24 Take 1 tablet by mouth daily. 10/08/20   [provider]  fluocinonide ointment (LIDEX) 8.75 % Apply 1 application topically See admin instructions. Apply 2 times a day to affected areas of lupus. Strict sun protection.  Stop when smooth. 03/25/19   [provider]  hydroxychloroquine (PLAQUENIL) 200 MG tablet Take 300 mg by mouth daily.    [provider]  lisinopril (PRINIVIL,ZESTRIL) 10 MG tablet Take 10 mg by mouth daily.    [provider]  loratadine (CLARITIN) 10 MG tablet Take 10 mg by mouth daily as needed. allergies    [provider]  medroxyPROGESTERone (DEPO-PROVERA) 150 MG/ML injection Inject 150 mg into the muscle every 3 (three) months. 11/08/20   [provider]  montelukast (SINGULAIR) 10 MG tablet Take 10 mg by mouth daily as needed. allergies    [provider]  ondansetron (ZOFRAN) 4 MG tablet Take 1 tablet (4 mg total) by mouth every 6 (six) hours as needed for nausea. 02/01/21   Allie Bossier, MD  valACYclovir (VALTREX) 1000 MG tablet Take 1,000 mg by mouth daily as needed (OB).    [provider]      Allergies    Amoxicillin    Review of Systems   Review of Systems  Respiratory:  Positive for shortness of breath.     Physical Exam Updated Vital Signs BP 118/79   Pulse (!) 101   Temp 98.9 F (37.2 C) (Oral)   Resp 18   Ht 4\' 8"  (1.422 m)   Wt 56.2 kg   SpO2 97%   BMI 27.78 kg/m  Physical Exam  ED Results / Procedures / Treatments   Labs (all labs ordered are listed, but only abnormal results are displayed) Labs Reviewed  CBC WITH DIFFERENTIAL/PLATELET - Abnormal; Notable for the following components:      Result Value   WBC 11.0 (*)    All other components within normal limits  COMPREHENSIVE METABOLIC PANEL - Abnormal; Notable for the following components:   Potassium 3.1 (*)    Glucose, Bld 156 (*)    All other components within normal limits  URINALYSIS, ROUTINE W REFLEX MICROSCOPIC - Abnormal; Notable for the following components:   Ketones, ur 15 (*)    Protein, ur 100 (*)    All other  components within normal limits  CULTURE, BLOOD (ROUTINE X 2)  CULTURE, BLOOD (ROUTINE X 2)  LACTIC ACID, PLASMA  LACTIC ACID, PLASMA  HCG, QUANTITATIVE, PREGNANCY  TROPONIN I (HIGH SENSITIVITY)  TROPONIN I (HIGH SENSITIVITY)    EKG EKG Interpretation  Date/Time:  Saturday November 29 2022 10:45:29 EST Ventricular Rate:  135 PR Interval:  112 QRS Duration: 74 QT Interval:  364 QTC Calculation: 546 R Axis:   94 Text Interpretation: Sinus tachycardia Rightward axis Nonspecific T wave abnormality Abnormal ECG When compared with ECG of 07-Oct-2017 20:12, Vent. rate has increased BY  48 BPM Criteria for Septal infarct are no longer Present Nonspecific T wave abnormality, improved in Anterior leads Rate  faster Confirmed by Glynn Octave [phone]) on 11/29/2022 11:00:12 AM  Radiology CT Angio Chest PE W and/or Wo Contrast  Result Date: 11/29/2022 CLINICAL DATA:  Shortness of breath.  Chest pressure.  Pain. EXAM: CT ANGIOGRAPHY CHEST WITH CONTRAST TECHNIQUE: Multidetector CT imaging of the chest was performed using the standard protocol during bolus administration of intravenous contrast. Multiplanar CT image reconstructions and MIPs were obtained to evaluate the vascular anatomy. RADIATION DOSE REDUCTION: This exam was performed according to the departmental dose-optimization program which includes automated exposure control, adjustment of the mA and/or kV according to patient size and/or use of iterative reconstruction technique. CONTRAST:  OMNIPAQUE IOHEXOL 350 MG/ML SOLN COMPARISON:  None Available. FINDINGS: Cardiovascular: Satisfactory opacification of the pulmonary arteries to the segmental level. No evidence of pulmonary embolism. Normal heart size. No pericardial effusion. Mediastinum/Nodes: No enlarged mediastinal, hilar, or axillary lymph nodes. Thyroid gland, trachea, and esophagus demonstrate no significant findings. Lungs/Pleura: Lungs are clear. No pleural effusion or pneumothorax. Upper Abdomen: No acute abnormality. Musculoskeletal: No chest wall abnormality. No acute or significant osseous findings. Review of the MIP images confirms the above findings. IMPRESSION: No evidence of pulmonary embolism or other acute abnormality. Electronically Signed   By: Gerome Sam III M.D.   On: 11/29/2022 12:44   DG Chest Port 1 View  Result Date: 11/29/2022 CLINICAL DATA:  27 year old female with history of shortness of breath. Body aches. EXAM: PORTABLE CHEST 1 VIEW COMPARISON:  Chest x-ray 01/30/2021. FINDINGS: Lung volumes are normal. No consolidative airspace disease. No pleural effusions. No pneumothorax. No pulmonary nodule or mass noted. Pulmonary vasculature and the cardiomediastinal  silhouette are within normal limits. IMPRESSION: No radiographic evidence of acute cardiopulmonary disease. Electronically Signed   By: Trudie Reed M.D.   On: 11/29/2022 11:40    Procedures Procedures  {Document cardiac monitor, telemetry assessment procedure when appropriate:1}  Medications Ordered in ED Medications  albuterol (VENTOLIN HFA) 108 (90 Base) MCG/ACT inhaler 2 puff (2 puffs Inhalation Given 11/29/22 1059)  lactated ringers bolus 500 mL (0 mLs Intravenous Stopped 11/29/22 1406)  acetaminophen (TYLENOL) tablet 650 mg (650 mg Oral Given 11/29/22 1158)  iohexol (OMNIPAQUE) 350 MG/ML injection 100 mL (100 mLs Intravenous Contrast Given 11/29/22 1235)  methylPREDNISolone sodium succinate (SOLU-MEDROL) 125 mg/2 mL injection 125 mg (125 mg Intravenous Given 11/29/22 1300)  ipratropium-albuterol (DUONEB) 0.5-2.5 (3) MG/3ML nebulizer solution 3 mL (3 mLs Nebulization Given 11/29/22 1307)  lactated ringers bolus 1,000 mL (1,000 mLs Intravenous New Bag/Given 11/29/22 1452)  potassium chloride SA (KLOR-CON M) CR tablet 40 mEq (40 mEq Oral Given 11/29/22 1450)    ED Course/ Medical Decision Making/ A&P   {   Click here for ABCD2, HEART and other calculatorsREFRESH Note before signing :1}  Medical Decision Making Amount and/or Complexity of Data Reviewed Labs: ordered. Radiology: ordered. ECG/medicine tests: ordered.  Risk OTC drugs. Prescription drug management.   ***  {Document critical care time when appropriate:1} {Document review of labs and clinical decision tools ie heart score, Chads2Vasc2 etc:1}  {Document your independent review of radiology images, and any outside records:1} {Document your discussion with family members, caretakers, and with consultants:1} {Document social determinants of health affecting pt's care:1} {Document your decision making why or why not admission, treatments were needed:1} Final Clinical Impression(s) / ED  Diagnoses Final diagnoses:  None    Rx / DC Orders ED Discharge Orders     None

## 2022-11-29 NOTE — ED Triage Notes (Signed)
Patient arrives with complaints of shortness of breath, chest pressure, and body pain x2 days. Patient recently tested Positive for the flu at Urgent Care yesterday and symptoms have worsened.

## 2022-11-29 NOTE — ED Notes (Signed)
RT educated patient on the use of nebulizer treatments and the medication

## 2022-11-29 NOTE — Discharge Instructions (Addendum)
You were seen here today for evaluation of your cough and cold symptoms. Your lab work mostly unremarkable. It showed you had some dehydration which we replinished you with IV fluids. Your imaging was unremarkable. I likely think this was an exacerbation of your flu symptoms. Please keep taking your Tamiflu as prescribed. I am also prescribing you additional prednisone to take for 5 days to help with these symptoms. Additionally, you can use the inhaler as needed for your cough and shortness of breath. I recommended taking Tylenol 1000mg  every 6 hours for your bodyaches and fever. Please follow up with your PCP for further evaluation. If you have any concerns, new or worsening symptoms, please return to the nearest ER for re-evaluation.  Contact a doctor if: Your condition does not get better as soon as expected. You have a hard time doing your normal activities, even after you rest. You have new symptoms. You cannot walk up stairs. You cannot exercise the way you normally do. Get help right away if: Your shortness of breath gets worse. You have trouble breathing when you are resting. You feel light-headed or you faint. You have a cough that is not helped by medicines. You cough up blood. You have pain with breathing. You have pain in your chest, arms, shoulders, or belly (abdomen). You have a fever. These symptoms may be an emergency. Get help right away. Call 911. Do not wait to see if the symptoms will go away. Do not drive yourself to the hospital.

## 2022-12-04 LAB — CULTURE, BLOOD (ROUTINE X 2)
Culture: NO GROWTH
Culture: NO GROWTH

## 2023-02-01 IMAGING — DX DG CHEST 1V PORT
1 series · 1 of 1 positions shown · non-contrast
Comparison: 10/07/2017

CLINICAL DATA: Fever

EXAM:
PORTABLE CHEST 1 VIEW

[chest ap]
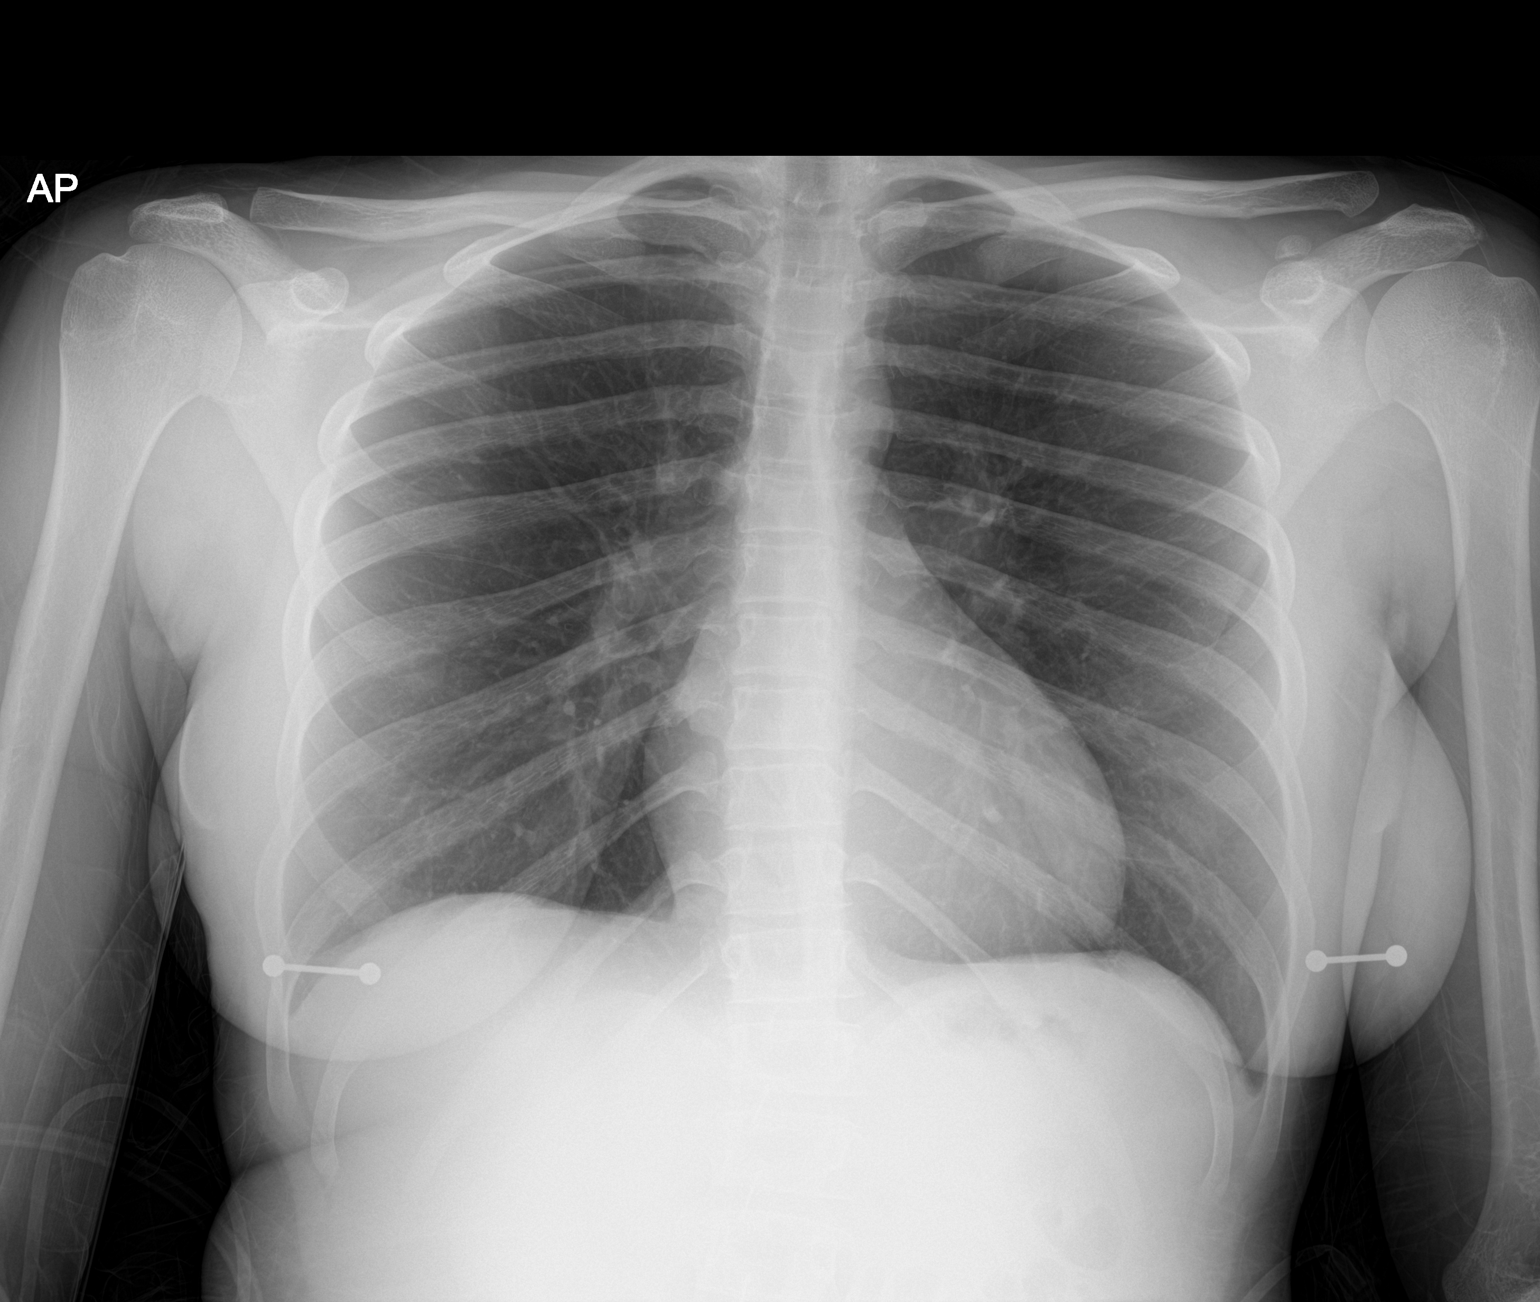

[1 of 1 positions shown; findings below may reference images not displayed]

FINDINGS: No consolidation, features of edema, pneumothorax, or effusion.
Pulmonary vascularity is normally distributed. The cardiomediastinal
contours are unremarkable. Metallic bilateral nipple ornamentation
is noted. Corticated mineralization seen adjacent the left coracoid,
possibly developmental given appearance on multiple prior exams. No
other acute osseous or soft tissue abnormality.
IMPRESSION: No acute cardiopulmonary abnormality.

## 2023-04-06 ENCOUNTER — Emergency Department (HOSPITAL_BASED_OUTPATIENT_CLINIC_OR_DEPARTMENT_OTHER)
Admission: EM | Admit: 2023-04-06 | Discharge: 2023-04-06 | Disposition: A | Discharge status: Home / Self Care | Payer: BC Managed Care – PPO | Attending: Emergency Medicine | Admitting: Emergency Medicine

## 2023-04-06 ENCOUNTER — Other Ambulatory Visit: Payer: Self-pay

## 2023-04-06 DIAGNOSIS — I10 Essential (primary) hypertension: Secondary | ICD-10-CM | POA: Diagnosis not present

## 2023-04-06 DIAGNOSIS — R1030 Lower abdominal pain, unspecified: Secondary | ICD-10-CM | POA: Diagnosis present

## 2023-04-06 DIAGNOSIS — L02214 Cutaneous abscess of groin: Secondary | ICD-10-CM | POA: Diagnosis not present

## 2023-04-06 MED ORDER — LIDOCAINE HCL (PF) 1 % IJ SOLN
5.0000 mL | Freq: Once | INTRAMUSCULAR | Status: AC
  Administered 2023-04-06: 5 mL
  Filled 2023-04-06: qty 5

## 2023-04-06 MED ORDER — DOXYCYCLINE HYCLATE 100 MG PO TABS
100.0000 mg | ORAL_TABLET | Freq: Once | ORAL | Status: AC
  Administered 2023-04-06: 100 mg via ORAL
  Filled 2023-04-06: qty 1

## 2023-04-06 MED ORDER — DOXYCYCLINE HYCLATE 100 MG PO CAPS: 100.0000 mg | ORAL_CAPSULE | Freq: Two times a day (BID) | ORAL | 0 refills | Status: DC

## 2023-04-06 NOTE — ED Notes (Signed)
At bedside for I&D performed by MD [facility]

## 2023-04-06 NOTE — ED Provider Notes (Signed)
[place] EM[facility]GENCY DEPARTMENT AT [facility] Provider Note   CSN: [id number] Arrival date & time: 04/06/23  0002     History  Chief Complaint  Patient presents with   Abscess    Darlene Ruiz is a 27 y.o. female.  Patient is a 27 year old female with past medical history of lupus, hypertension.  Patient presenting today with complaints of pain and swelling to the left groin.  This has been worsening over the past several days.  No fevers or chills.  No injury or trauma.  Pain is worse with ambulation with no alleviating factors.  The history is provided by the patient.       Home Medications Prior to Admission medications   Medication Sig Start Date End Date Taking? Authorizing Provider  azaTHIOprine (IMURAN) 50 MG tablet Take 100 mg by mouth daily. 11/26/16   [provider]  carisoprodol (SOMA) 350 MG tablet Take 1 tablet (350 mg total) by mouth 3 (three) times daily as needed for muscle spasms. 01/07/22   Redwine, Madison A, PA-C  Cetirizine HCl 10 MG TBDP Take 10 mg by mouth once for 1 dose. 01/07/22 01/07/22  Redwine, Madison A, PA-C  desonide (DESOWEN) 0.05 % ointment Apply 1 application topically daily.    [provider]  doxycycline (VIBRA-TABS) 100 MG tablet Take 100 mg by mouth 2 (two) times daily. 07/15/21   [provider]  ENVARSUS XR 4 MG TB24 Take 1 tablet by mouth daily. 10/08/20   [provider]  fluocinonide ointment (LIDEX) 0.05 % Apply 1 application topically See admin instructions. Apply 2 times a day to affected areas of lupus. Strict sun protection.  Stop when smooth. 03/25/19   [provider]  hydroxychloroquine (PLAQUENIL) 200 MG tablet Take 300 mg by mouth daily.    [provider]  lisinopril (PRINIVIL,ZESTRIL) 10 MG tablet Take 10 mg by mouth daily.    [provider]  loratadine (CLARITIN) 10 MG tablet Take 10 mg by mouth daily as needed. allergies    [provider]   medroxyPROGESTERone (DEPO-PROVERA) 150 MG/ML injection Inject 150 mg into the muscle every 3 (three) months. 11/08/20   [provider]  montelukast (SINGULAIR) 10 MG tablet Take 10 mg by mouth daily as needed. allergies    [provider]  ondansetron (ZOFRAN) 4 MG tablet Take 1 tablet (4 mg total) by mouth every 6 (six) hours as needed for nausea. 02/01/21   Drema Dallas, MD  predniSONE (DELTASONE) 20 MG tablet Take 2 tablets (40 mg total) by mouth daily. 11/29/22   Achille Rich, PA-C  valACYclovir (VALTREX) 1000 MG tablet Take 1,000 mg by mouth daily as needed (OB).    [provider]      Allergies    Tamiflu [oseltamivir] and Amoxicillin    Review of Systems   Review of Systems  All other systems reviewed and are negative.   Physical Exam Updated Vital Signs BP (!) 139/101   Pulse 89   Temp 97.8 F (36.6 C) (Oral)   Resp 18   Wt 60.8 kg   SpO2 100%   BMI 30.04 kg/m  Physical Exam Vitals and nursing note reviewed.  Constitutional:      Appearance: Normal appearance.  HENT:     Head: Normocephalic.  Pulmonary:     Effort: Pulmonary effort is normal.  Skin:    General: Skin is warm and dry.     Comments: In the left inguinal/suprapubic region, there is  an area of erythema with with a 2 cm x 2 cm fluctuant area.  Neurological:     Mental Status: She is alert and oriented to person, place, and time.     ED Results / Procedures / Treatments   Labs (all labs ordered are listed, but only abnormal results are displayed) Labs Reviewed - No data to display  EKG None  Radiology No results found.  Procedures Procedures   INCISION AND DRAINAGE Performed by: Geoffery Lyons Consent: Verbal consent obtained. Risks and benefits: risks, benefits and alternatives were discussed Type: abscess  Body area: Left groin  Anesthesia: local infiltration  Incision was made with a scalpel.  Local anesthetic: lidocaine 1% without  epinephrine  Anesthetic total: 4 ml  Complexity: complex Blunt dissection to break up loculations  Drainage: purulent  Drainage amount: Moderate  Packing material: No packing placed  Patient tolerance: Patient tolerated the procedure well with no immediate complications.    Medications Ordered in ED Medications  lidocaine (PF) (XYLOCAINE) 1 % injection 5 mL (5 mLs Infiltration Given by Other 04/06/23 0025)    ED Course/ Medical Decision Making/ A&P  Patient presenting with left groin pain, swelling, and redness.  Exam consistent with an abscess.  This was incised and drained as below.  Patient to be treated with doxycycline, warm compresses, and follow-up as needed.  Final Clinical Impression(s) / ED Diagnoses Final diagnoses:  None    Rx / DC Orders ED Discharge Orders     None         Geoffery Lyons, MD 04/06/23 [phone]

## 2023-04-06 NOTE — ED Notes (Signed)
Wound dressed-extra wound dressing supplies sent with pt. Voiced understanding of d/c instructions.

## 2023-04-06 NOTE — Discharge Instructions (Signed)
Begin taking doxycycline as prescribed.  Apply warm compresses as frequently as possible for the next several days.  Follow-up with primary doctor if not improving in the next few days, and return to the ER if symptoms significantly worsen or change.

## 2023-04-06 NOTE — ED Triage Notes (Signed)
Pt arrives pov, steady gait with c/o groin swelling, concern for abscess x 2 days pta. Ibuprofen at 2000

## 2023-04-07 ENCOUNTER — Emergency Department (HOSPITAL_BASED_OUTPATIENT_CLINIC_OR_DEPARTMENT_OTHER)
Admission: EM | Admit: 2023-04-07 | Discharge: 2023-04-07 | Disposition: A | Discharge status: Home / Self Care | Payer: BC Managed Care – PPO | Attending: Emergency Medicine | Admitting: Emergency Medicine

## 2023-04-07 ENCOUNTER — Encounter (HOSPITAL_BASED_OUTPATIENT_CLINIC_OR_DEPARTMENT_OTHER): Payer: Self-pay

## 2023-04-07 ENCOUNTER — Other Ambulatory Visit: Payer: Self-pay

## 2023-04-07 DIAGNOSIS — L02214 Cutaneous abscess of groin: Secondary | ICD-10-CM | POA: Insufficient documentation

## 2023-04-07 DIAGNOSIS — E119 Type 2 diabetes mellitus without complications: Secondary | ICD-10-CM | POA: Diagnosis not present

## 2023-04-07 DIAGNOSIS — L03818 Cellulitis of other sites: Secondary | ICD-10-CM

## 2023-04-07 MED ORDER — CEPHALEXIN 500 MG PO CAPS: 500.0000 mg | ORAL_CAPSULE | Freq: Four times a day (QID) | ORAL | 0 refills | Status: DC

## 2023-04-07 NOTE — ED Notes (Signed)
Pt's area of erythema circled with skin marker. Pt advised to seek evaluation if erythema spreads past marking and pt verbalized understanding

## 2023-04-07 NOTE — ED Triage Notes (Signed)
Patient here POV from Home.  Endorses Abscess Like formation noted for 3-4 Days. Drained on Sunday in ED but Seeks Re-Evaluation for worsened Pain and returned Swelling  Some Chills. No Known Fevers. Mild Drainage since but very Minimal to Groin Area.   NAD Noted during Triage. A&Ox4. GCS 15. Ambulatory.

## 2023-04-07 NOTE — Discharge Instructions (Signed)
It was a pleasure take care of you today!  You have been sent a prescription for Keflex to take along with the doxycycline.  You may take over-the-counter Tylenol and alternate with over-the-counter ibuprofen as needed for your symptoms.  The area of the skin has been marked today.  Return to the emergency department if the redness extends outside of the marked area.  Follow-up with your primary care provider regarding today's ED visit.  Continue to take your medications as prescribed.  Return to the emergency department if you experience increasing/worsening symptoms.

## 2023-04-07 NOTE — ED Notes (Signed)
Chaperone for EDP for groin exam. Pt tolerated well.  °

## 2023-04-07 NOTE — ED Provider Notes (Signed)
[place] EMERGENCY DEPARTMENT AT [facility] Provider Note   CSN: [id number] Arrival date & time: 04/07/23  1430     History  Chief Complaint  Patient presents with   Abscess    Darlene Ruiz is a 27 y.o. female with a past medical history of lupus who presents emergency department with concerns for abscess to groin region x 3 days.  Was evaluated in the emergency department at the onset of her symptoms and had the area drained.  Notes bloody drainage at home.  Denies fever.  Patient reports that they are here today due to continued pain to the area.  Has tried ibuprofen at home.  Denies past medical history of diabetes.  Patient has been compliant with the doxycycline.     The history is provided by the patient. No language interpreter was used.       Home Medications Prior to Admission medications   Medication Sig Start Date End Date Taking? Authorizing Provider  cephALEXin (KEFLEX) 500 MG capsule Take 1 capsule (500 mg total) by mouth 4 (four) times daily. 04/07/23  Yes Rahi Chandonnet A, PA-C  azaTHIOprine (IMURAN) 50 MG tablet Take 100 mg by mouth daily. 11/26/16   [provider]  carisoprodol (SOMA) 350 MG tablet Take 1 tablet (350 mg total) by mouth 3 (three) times daily as needed for muscle spasms. 01/07/22   Redwine, Madison A, PA-C  Cetirizine HCl 10 MG TBDP Take 10 mg by mouth once for 1 dose. 01/07/22 01/07/22  Redwine, Madison A, PA-C  desonide (DESOWEN) 0.05 % ointment Apply 1 application topically daily.    [provider]  doxycycline (VIBRAMYCIN) 100 MG capsule Take 1 capsule (100 mg total) by mouth 2 (two) times daily. One po bid x 7 days 04/06/23   Geoffery Lyons, MD  ENVARSUS XR 4 MG TB24 Take 1 tablet by mouth daily. 10/08/20   [provider]  fluocinonide ointment (LIDEX) 0.05 % Apply 1 application topically See admin instructions. Apply 2 times a day to affected areas of lupus. Strict sun protection.  Stop when smooth. 03/25/19    [provider]  hydroxychloroquine (PLAQUENIL) 200 MG tablet Take 300 mg by mouth daily.    [provider]  lisinopril (PRINIVIL,ZESTRIL) 10 MG tablet Take 10 mg by mouth daily.    [provider]  loratadine (CLARITIN) 10 MG tablet Take 10 mg by mouth daily as needed. allergies    [provider]  medroxyPROGESTERone (DEPO-PROVERA) 150 MG/ML injection Inject 150 mg into the muscle every 3 (three) months. 11/08/20   [provider]  montelukast (SINGULAIR) 10 MG tablet Take 10 mg by mouth daily as needed. allergies    [provider]  ondansetron (ZOFRAN) 4 MG tablet Take 1 tablet (4 mg total) by mouth every 6 (six) hours as needed for nausea. 02/01/21   Drema Dallas, MD  predniSONE (DELTASONE) 20 MG tablet Take 2 tablets (40 mg total) by mouth daily. 11/29/22   Achille Rich, PA-C  valACYclovir (VALTREX) 1000 MG tablet Take 1,000 mg by mouth daily as needed (OB).    [provider]      Allergies    Tamiflu [oseltamivir] and Amoxicillin    Review of Systems   Review of Systems  All other systems reviewed and are negative.   Physical Exam Updated Vital Signs BP 135/89 (BP Location: Right Arm)   Pulse (!) 118   Temp 98.6 F (37 C)   Resp 18  Ht 4\' 8"  (1.422 m)   Wt 60.8 kg   SpO2 100%   BMI 30.05 kg/m  Physical Exam Vitals and nursing note reviewed. Exam conducted with a chaperone present.  Constitutional:      General: She is not in acute distress.    Appearance: Normal appearance.  Eyes:     General: No scleral icterus.    Extraocular Movements: Extraocular movements intact.  Cardiovascular:     Rate and Rhythm: Normal rate.  Pulmonary:     Effort: Pulmonary effort is normal. No respiratory distress.  Abdominal:     Palpations: Abdomen is soft. There is no mass.     Tenderness: There is no abdominal tenderness.  Genitourinary:    Comments: RN chaperone Clydie Braun) present for exam. Area of erythema  noted to central and left mons pubis/inguinal region. Mild increase warmth noted to the area. No appreciable area of fluctuance noted. No TTP noted to mons pubis. Previous incision is well healing without drainage appreciated.  Musculoskeletal:        General: Normal range of motion.     Cervical back: Neck supple.  Skin:    General: Skin is warm and dry.     Findings: No rash.  Neurological:     Mental Status: She is alert.     Sensory: Sensation is intact.     Motor: Motor function is intact.  Psychiatric:        Behavior: Behavior normal.     ED Results / Procedures / Treatments   Labs (all labs ordered are listed, but only abnormal results are displayed) Labs Reviewed - No data to display  EKG None  Radiology No results found.  Procedures Procedures    Medications Ordered in ED Medications - No data to display  ED Course/ Medical Decision Making/ A&P Clinical Course as of 04/07/23 1808  Tue Apr 07, 2023  1739 Patient reevaluated and discussed with patient discharge treatment plan consistent of added on Keflex.  Instructed patient to to continue with her doxycycline.  Discussed with patient that we will mark the area today.  Instructed patient to have follow-up with her primary care provider.  Answered all vital questions.  Patient appears safe for discharge at this time. [SB]    Clinical Course User Index [SB] Silvano Garofano A, PA-C                             Medical Decision Making Risk Prescription drug management.   Pt presents with concerns for abscess to groin region onset 3 days.  Patient afebrile.  Has been compliant with the doxycycline at home.  On exam patient with RN chaperone Augusto Gamble Pegram) present for exam. Area of erythema noted to central and left mons pubis/inguinal region. Mild increase warmth noted to the area. No appreciable area of fluctuance noted. No TTP noted to mons pubis. Previous incision is well healing without drainage appreciated.   Differential diagnosis includes cellulitis, abscess.    Co morbidities that complicate the patient evaluation: Lupus  Additional history obtained:  External records from outside source obtained and reviewed including: Pt was seen in the ED on 04/06/23 for concerns for abscess.  The area was drained and patient was sent with a prescription for doxycycline.  Disposition: Presenting suspicious for cellulitis.  Doubt concerns at this time for appreciable abscess.. After consideration of the diagnostic results and the patients response to treatment, I feel that the patient  would benefit from Discharge home. Pt sent with prescription for keflex to patient pharmacy. Pt instructed to follow up with PCP regarding todays ED visit. Instructed to continue with doxycycline as prescribed. Area is demarcated in the ED today. Supportive care measures and strict return precautions discussed with patient at bedside. Pt acknowledges and verbalizes understanding. Pt appears safe for discharge. Follow up as indicated in discharge paperwork.    This chart was dictated using voice recognition software, Dragon. Despite the best efforts of this provider to proofread and correct errors, errors may still occur which can change documentation Final Clinical Impression(s) / ED Diagnoses Final diagnoses:  Cellulitis of other specified site    Rx / DC Orders ED Discharge Orders          Ordered    cephALEXin (KEFLEX) 500 MG capsule  4 times daily        04/07/23 1803              Sibyl Mikula A, PA-C 04/07/23 1810    Ernie Avena, MD 04/07/23 1907

## 2023-04-15 ENCOUNTER — Other Ambulatory Visit: Payer: Self-pay | Admitting: Family Medicine

## 2023-04-15 ENCOUNTER — Ambulatory Visit
Admission: RE | Admit: 2023-04-15 | Discharge: 2023-04-15 | Disposition: A | Discharge status: Home / Self Care | Payer: BC Managed Care – PPO | Source: Ambulatory Visit | Attending: Family Medicine | Admitting: Family Medicine

## 2023-04-15 DIAGNOSIS — R229 Localized swelling, mass and lump, unspecified: Secondary | ICD-10-CM

## 2023-08-20 ENCOUNTER — Emergency Department (HOSPITAL_BASED_OUTPATIENT_CLINIC_OR_DEPARTMENT_OTHER): Payer: BC Managed Care – PPO

## 2023-08-20 ENCOUNTER — Other Ambulatory Visit: Payer: Self-pay

## 2023-08-20 ENCOUNTER — Encounter (HOSPITAL_BASED_OUTPATIENT_CLINIC_OR_DEPARTMENT_OTHER): Payer: Self-pay

## 2023-08-20 ENCOUNTER — Emergency Department (HOSPITAL_BASED_OUTPATIENT_CLINIC_OR_DEPARTMENT_OTHER)
Admission: EM | Admit: 2023-08-20 | Discharge: 2023-08-20 | Disposition: A | Discharge status: Home / Self Care | Payer: BC Managed Care – PPO | Attending: Emergency Medicine | Admitting: Emergency Medicine

## 2023-08-20 DIAGNOSIS — J029 Acute pharyngitis, unspecified: Secondary | ICD-10-CM | POA: Diagnosis present

## 2023-08-20 DIAGNOSIS — Z79899 Other long term (current) drug therapy: Secondary | ICD-10-CM | POA: Insufficient documentation

## 2023-08-20 DIAGNOSIS — Z20822 Contact with and (suspected) exposure to covid-19: Secondary | ICD-10-CM | POA: Diagnosis not present

## 2023-08-20 DIAGNOSIS — J020 Streptococcal pharyngitis: Secondary | ICD-10-CM | POA: Diagnosis not present

## 2023-08-20 DIAGNOSIS — R000 Tachycardia, unspecified: Secondary | ICD-10-CM | POA: Diagnosis not present

## 2023-08-20 DIAGNOSIS — I10 Essential (primary) hypertension: Secondary | ICD-10-CM | POA: Diagnosis not present

## 2023-08-20 LAB — RESP PANEL BY RT-PCR (RSV, FLU A&B, COVID)  RVPGX2
Influenza A by PCR: NEGATIVE
Influenza B by PCR: NEGATIVE
Resp Syncytial Virus by PCR: NEGATIVE
SARS Coronavirus 2 by RT PCR: NEGATIVE

## 2023-08-20 LAB — BASIC METABOLIC PANEL
Anion gap: 9 (ref 5–15)
BUN: 11 mg/dL (ref 6–20)
CO2: 25 mmol/L (ref 22–32)
Calcium: 9.3 mg/dL (ref 8.9–10.3)
Chloride: 104 mmol/L (ref 98–111)
Creatinine, Ser: 0.8 mg/dL (ref 0.44–1.00)
GFR, Estimated: >60 mL/min (ref >60)
Glucose, Bld: 85 mg/dL (ref 70–99)
Potassium: 3.8 mmol/L (ref 3.5–5.1)
Sodium: 138 mmol/L (ref 135–145)

## 2023-08-20 LAB — CBC WITH DIFFERENTIAL/PLATELET
Abs Immature Granulocytes: 0.03 10*3/uL (ref 0.00–0.07)
Basophils Absolute: 0 10*3/uL (ref 0.0–0.1)
Basophils Relative: 1 %
Eosinophils Absolute: 0 10*3/uL (ref 0.0–0.5)
Eosinophils Relative: 0 %
HCT: 39.5 % (ref 36.0–46.0)
Hemoglobin: 13.5 g/dL (ref 12.0–15.0)
Immature Granulocytes: 0 %
Lymphocytes Relative: 26 %
Lymphs Abs: 2.2 10*3/uL (ref 0.7–4.0)
MCH: 29.5 pg (ref 26.0–34.0)
MCHC: 34.2 g/dL (ref 30.0–36.0)
MCV: 86.2 fL (ref 80.0–100.0)
Monocytes Absolute: 0.6 10*3/uL (ref 0.1–1.0)
Monocytes Relative: 7 %
Neutro Abs: 5.7 10*3/uL (ref 1.7–7.7)
Neutrophils Relative %: 66 %
Platelets: 220 10*3/uL (ref 150–400)
RBC: 4.58 MIL/uL (ref 3.87–5.11)
RDW: 14 % (ref 11.5–15.5)
WBC: 8.6 10*3/uL (ref 4.0–10.5)
nRBC: 0 % (ref 0.0–0.2)

## 2023-08-20 LAB — GROUP A STREP BY PCR: Group A Strep by PCR: DETECTED — AB

## 2023-08-20 MED ORDER — LIDOCAINE HCL (PF) 1 % IJ SOLN
INTRAMUSCULAR | Status: AC
  Filled 2023-08-20: qty 5

## 2023-08-20 MED ORDER — IOHEXOL 300 MG/ML  SOLN
100.0000 mL | Freq: Once | INTRAMUSCULAR | Status: AC | PRN
  Administered 2023-08-20: 75 mL via INTRAVENOUS

## 2023-08-20 MED ORDER — LIDOCAINE VISCOUS HCL 2 % MT SOLN
15.0000 mL | Freq: Once | OROMUCOSAL | Status: AC
  Administered 2023-08-20: 15 mL via OROMUCOSAL
  Filled 2023-08-20: qty 15

## 2023-08-20 MED ORDER — PENICILLIN G BENZATHINE 1200000 UNIT/2ML IM SUSY
1.2000 10*6.[IU] | PREFILLED_SYRINGE | Freq: Once | INTRAMUSCULAR | Status: AC
  Administered 2023-08-20: 1.2 10*6.[IU] via INTRAMUSCULAR
  Filled 2023-08-20: qty 2

## 2023-08-20 MED ORDER — ACETAMINOPHEN 325 MG PO TABS
650.0000 mg | ORAL_TABLET | Freq: Once | ORAL | Status: AC
  Administered 2023-08-20: 650 mg via ORAL
  Filled 2023-08-20: qty 2

## 2023-08-20 NOTE — ED Triage Notes (Signed)
Pt POV from home c/o pain and subjective swelling on the right side of her throat, radiating to right jaw and back of head that started overnight while trying to sleep last night. Pt also c/o chills and body aches

## 2023-08-20 NOTE — Discharge Instructions (Addendum)
You were treated for strep pharyngitis today.  The antibiotics that you received today should take care of it.  I would like for you to follow-up with your primary care doctor for further evaluation.  You may return to the emergency department for any worsening symptoms.

## 2023-08-20 NOTE — ED Provider Notes (Signed)
[place] EMERGENCY DEPARTMENT AT [facility] Provider Note   CSN: [id number] Arrival date & time: 08/20/23  0803     History Chief Complaint  Patient presents with   Sore Throat    Darlene Ruiz is a 27 y.o. female patient with history of lupus on immunosuppressive medications and hypertension who presents to the emergency department with right sided sore throat which radiates into the head.  She has noticed a subjective fever and chills this morning.  Pain started yesterday.  She denies any sick contacts, chest pain, shortness of breath, cough, abdominal pain, nausea, vomiting, diarrhea.   Sore Throat       Home Medications Prior to Admission medications   Medication Sig Start Date End Date Taking? Authorizing Provider  azaTHIOprine (IMURAN) 50 MG tablet Take 100 mg by mouth daily. 11/26/16   [provider]  carisoprodol (SOMA) 350 MG tablet Take 1 tablet (350 mg total) by mouth 3 (three) times daily as needed for muscle spasms. 01/07/22   Redwine, Madison A, PA-C  cephALEXin (KEFLEX) 500 MG capsule Take 1 capsule (500 mg total) by mouth 4 (four) times daily. 04/07/23   Blue, Soijett A, PA-C  Cetirizine HCl 10 MG TBDP Take 10 mg by mouth once for 1 dose. 01/07/22 01/07/22  Redwine, Madison A, PA-C  desonide (DESOWEN) 0.05 % ointment Apply 1 application topically daily.    [provider]  doxycycline (VIBRAMYCIN) 100 MG capsule Take 1 capsule (100 mg total) by mouth 2 (two) times daily. One po bid x 7 days 04/06/23   Geoffery Lyons, MD  ENVARSUS XR 4 MG TB24 Take 1 tablet by mouth daily. 10/08/20   [provider]  fluocinonide ointment (LIDEX) 0.05 % Apply 1 application topically See admin instructions. Apply 2 times a day to affected areas of lupus. Strict sun protection.  Stop when smooth. 03/25/19   [provider]  hydroxychloroquine (PLAQUENIL) 200 MG tablet Take 300 mg by mouth daily.    [provider]  lisinopril  (PRINIVIL,ZESTRIL) 10 MG tablet Take 10 mg by mouth daily.    [provider]  loratadine (CLARITIN) 10 MG tablet Take 10 mg by mouth daily as needed. allergies    [provider]  medroxyPROGESTERone (DEPO-PROVERA) 150 MG/ML injection Inject 150 mg into the muscle every 3 (three) months. 11/08/20   [provider]  montelukast (SINGULAIR) 10 MG tablet Take 10 mg by mouth daily as needed. allergies    [provider]  ondansetron (ZOFRAN) 4 MG tablet Take 1 tablet (4 mg total) by mouth every 6 (six) hours as needed for nausea. 02/01/21   Drema Dallas, MD  predniSONE (DELTASONE) 20 MG tablet Take 2 tablets (40 mg total) by mouth daily. 11/29/22   Achille Rich, PA-C  valACYclovir (VALTREX) 1000 MG tablet Take 1,000 mg by mouth daily as needed (OB).    [provider]      Allergies    Tamiflu [oseltamivir] and Amoxicillin    Review of Systems   Review of Systems  All other systems reviewed and are negative.   Physical Exam Updated Vital Signs BP (!) 125/94   Pulse 85   Temp (!) 100.9 F (38.3 C) (Oral)   Resp 16   Ht 4\' 8"  (1.422 m)   Wt 59.9 kg   SpO2 100%   BMI 29.59 kg/m  Physical Exam Vitals and nursing note reviewed.  Constitutional:      General: She is not in acute  distress.    Appearance: Normal appearance.  HENT:     Head: Normocephalic and atraumatic.     Mouth/Throat:     Comments: Uvula is midline and there is no obvious tonsillar hypertrophy.  There is some mild posterior pharyngeal erythema without any evidence of exudate. Eyes:     General:        Right eye: No discharge.        Left eye: No discharge.  Neck:     Comments: Right side of the neck is moderately tender to palpation and swollen.  There is evidence of cervical adenopathy which is worse on the right. Cardiovascular:     Rate and Rhythm: Tachycardia present.  Pulmonary:     Comments: Clear to auscultation bilaterally.  Normal effort.  No respiratory  distress.  No evidence of wheezes, rales, or rhonchi heard throughout. Abdominal:     General: Abdomen is flat. Bowel sounds are normal. There is no distension.     Tenderness: There is no abdominal tenderness. There is no guarding or rebound.  Musculoskeletal:        General: Normal range of motion.     Cervical back: Neck supple.  Skin:    General: Skin is warm and dry.     Findings: No rash.  Neurological:     General: No focal deficit present.     Mental Status: She is alert.  Psychiatric:        Mood and Affect: Mood normal.        Behavior: Behavior normal.     ED Results / Procedures / Treatments   Labs (all labs ordered are listed, but only abnormal results are displayed) Labs Reviewed  GROUP A STREP BY PCR - Abnormal; Notable for the following components:      Result Value   Group A Strep by PCR DETECTED (*)    All other components within normal limits  RESP PANEL BY RT-PCR (RSV, FLU A&B, COVID)  RVPGX2  CBC WITH DIFFERENTIAL/PLATELET  BASIC METABOLIC PANEL    EKG None  Radiology CT Soft Tissue Neck W Contrast  Result Date: 08/20/2023 CLINICAL DATA:  Pain and subjective swelling on the right side of the throat radiating to the jaw and back of head. EXAM: CT NECK WITH CONTRAST TECHNIQUE: Multidetector CT imaging of the neck was performed using the standard protocol following the bolus administration of intravenous contrast. RADIATION DOSE REDUCTION: This exam was performed according to the departmental dose-optimization program which includes automated exposure control, adjustment of the mA and/or kV according to patient size and/or use of iterative reconstruction technique. CONTRAST:  75mL OMNIPAQUE IOHEXOL 300 MG/ML  SOLN COMPARISON:  None Available. FINDINGS: Pharynx and larynx: The nasal cavity and nasopharynx are unremarkable. There is mild mucosal edema along the right lateral oropharyngeal wall extending to the hypopharyngeal/laryngeal region on the right. The  parapharyngeal spaces are clear. The oral cavity is unremarkable. The vocal folds and epiglottis appear normal. There is no retropharyngeal collection. The airway is patent. Salivary glands: The parotid and submandibular glands are unremarkable. Thyroid: Unremarkable. Lymph nodes: There are prominent and hyperenhancing lymph nodes on the right at level IIA measuring 1.1 cm (3-32) and at level IIA/III measuring 1.6 cm. There is ill-defined infiltration of the surrounding fat. There is no significant lymphadenopathy on the left. Vascular: The major vasculature of the neck is unremarkable. Limited intracranial: Unremarkable. Visualized orbits: Unremarkable. Mastoids and visualized paranasal sinuses: There is mucosal thickening in the bilateral posterior ethmoid air  cells. The paranasal sinuses are otherwise clear. The mastoid air cells and middle ear cavities are clear. Skeleton: There is no acute osseous abnormality or suspicious osseous lesion. Upper chest: The imaged lung apices are clear. Other: None. IMPRESSION: Mucosal edema along the right oropharyngeal wall extending towards the hypopharynx/larynx most in keeping with acute pharyngitis with reactive cervical lymphadenopathy. No evidence of abscess or airway compromise. Electronically Signed   By: Lesia Hausen M.D.   On: 08/20/2023 11:32    Procedures Procedures    Medications Ordered in ED Medications  penicillin g benzathine (BICILLIN LA) 1200000 UNIT/2ML injection 1.2 Million Units (has no administration in time range)  acetaminophen (TYLENOL) tablet 650 mg (650 mg Oral Given 08/20/23 0928)  lidocaine (XYLOCAINE) 2 % viscous mouth solution 15 mL (15 mLs Mouth/Throat Given 08/20/23 0932)  iohexol (OMNIPAQUE) 300 MG/ML solution 100 mL (75 mLs Intravenous Contrast Given 08/20/23 1023)    ED Course/ Medical Decision Making/ A&P Clinical Course as of 08/20/23 1152  Thu Aug 20, 2023  1142 CBC with Differential Normal. [CF]  1142 Basic metabolic  panel Normal. [CF]  6213 Resp panel by RT-PCR (RSV, Flu A&B, Covid) Throat Negative. [CF]  1142 Group A Strep by PCR(!) Positive for strep throat. [CF]  1142 CT Soft Tissue Neck W Contrast I personally ordered and interpreted the study and there is evidence of some reactive lymphadenopathy but no evidence of abscess.  I do agree with radiologist interpretation. [CF]  1142 On reevaluation, patient states she is feeling slightly better.  I went over medication options for strep throat.  Patient opted for Bicillin IM injection here. [CF]  1149 I went over the patient's amoxicillin allergy.  It turns out is just an intolerance as it does give her an upset stomach.  She has never had an anaphylactic reaction to amoxicillin or anything in the penicillin class. [CF]    Clinical Course User Index [CF] Teressa Lower, PA-C   {   Click here for ABCD2, HEART and other calculators  Medical Decision Making Tymira Villalona Kratt is a 27 y.o. female patient who presents to the emergency department today for further evaluation of sore throat, fever, and chills.  Patient is febrile, hypertensive, and tachycardic here.  Patient does appear uncomfortable but she is overall not really toxic appearing.  Given the patient does take immunosuppressant therapy for her lupus I will likely get some basic labs and get a CT scan of the neck to look for deep-seeded infection.  I will plan to give her some Tylenol as well for her fever.  Will treat for strep pharyngitis with Bicillin 1 time here in the emergency department.  Patient agreeable to plan.  Strict return precautions were discussed.  She is safe for discharge.  Amount and/or Complexity of Data Reviewed Labs: ordered. Decision-making details documented in ED Course. Radiology: ordered. Decision-making details documented in ED Course.  Risk OTC drugs. Prescription drug management.    Final Clinical Impression(s) / ED Diagnoses Final diagnoses:  Strep  pharyngitis    Rx / DC Orders ED Discharge Orders     None         Teressa Lower, [place] 08/20/23 1152    Vanetta Mulders, MD 08/21/23 [phone]

## 2023-08-20 NOTE — ED Notes (Signed)
Patient transported to CT 

## 2023-08-24 NOTE — Plan of Care (Signed)
[facility] Tonsillectomy/Adenoidectomy, Postoperative PEDS care plan entered in error.

## 2023-12-28 ENCOUNTER — Emergency Department (HOSPITAL_COMMUNITY): Payer: BC Managed Care – PPO

## 2023-12-28 ENCOUNTER — Other Ambulatory Visit: Payer: Self-pay

## 2023-12-28 ENCOUNTER — Encounter (HOSPITAL_COMMUNITY): Payer: Self-pay

## 2023-12-28 ENCOUNTER — Emergency Department (HOSPITAL_COMMUNITY): Payer: BC Managed Care – PPO | Admitting: Anesthesiology

## 2023-12-28 ENCOUNTER — Ambulatory Visit (HOSPITAL_COMMUNITY)
Admission: EM | Admit: 2023-12-28 | Discharge: 2023-12-28 | Disposition: A | Discharge status: Home / Self Care | Payer: BC Managed Care – PPO | Attending: Emergency Medicine | Admitting: Emergency Medicine

## 2023-12-28 ENCOUNTER — Encounter (HOSPITAL_COMMUNITY)
Admission: EM | Disposition: A | Discharge status: Home / Self Care | Payer: Self-pay | Source: Home / Self Care | Attending: Emergency Medicine

## 2023-12-28 DIAGNOSIS — E669 Obesity, unspecified: Secondary | ICD-10-CM | POA: Insufficient documentation

## 2023-12-28 DIAGNOSIS — F17290 Nicotine dependence, other tobacco product, uncomplicated: Secondary | ICD-10-CM | POA: Insufficient documentation

## 2023-12-28 DIAGNOSIS — Z7952 Long term (current) use of systemic steroids: Secondary | ICD-10-CM | POA: Insufficient documentation

## 2023-12-28 DIAGNOSIS — M8580 Other specified disorders of bone density and structure, unspecified site: Secondary | ICD-10-CM | POA: Insufficient documentation

## 2023-12-28 DIAGNOSIS — S82891A Other fracture of right lower leg, initial encounter for closed fracture: Secondary | ICD-10-CM | POA: Diagnosis present

## 2023-12-28 DIAGNOSIS — S82851A Displaced trimalleolar fracture of right lower leg, initial encounter for closed fracture: Secondary | ICD-10-CM | POA: Insufficient documentation

## 2023-12-28 DIAGNOSIS — Z79624 Long term (current) use of inhibitors of nucleotide synthesis: Secondary | ICD-10-CM | POA: Diagnosis not present

## 2023-12-28 DIAGNOSIS — Z79631 Long term (current) use of antimetabolite agent: Secondary | ICD-10-CM | POA: Diagnosis not present

## 2023-12-28 DIAGNOSIS — S93431A Sprain of tibiofibular ligament of right ankle, initial encounter: Secondary | ICD-10-CM | POA: Diagnosis not present

## 2023-12-28 DIAGNOSIS — I10 Essential (primary) hypertension: Secondary | ICD-10-CM | POA: Diagnosis not present

## 2023-12-28 DIAGNOSIS — M329 Systemic lupus erythematosus, unspecified: Secondary | ICD-10-CM | POA: Diagnosis not present

## 2023-12-28 DIAGNOSIS — W109XXA Fall (on) (from) unspecified stairs and steps, initial encounter: Secondary | ICD-10-CM | POA: Insufficient documentation

## 2023-12-28 DIAGNOSIS — Z79899 Other long term (current) drug therapy: Secondary | ICD-10-CM | POA: Diagnosis not present

## 2023-12-28 DIAGNOSIS — Z793 Long term (current) use of hormonal contraceptives: Secondary | ICD-10-CM | POA: Insufficient documentation

## 2023-12-28 DIAGNOSIS — Z6831 Body mass index (BMI) 31.0-31.9, adult: Secondary | ICD-10-CM | POA: Insufficient documentation

## 2023-12-28 HISTORY — PX: ORIF ANKLE FRACTURE: SHX5408

## 2023-12-28 LAB — HCG, SERUM, QUALITATIVE: Preg, Serum: NEGATIVE

## 2023-12-28 LAB — BASIC METABOLIC PANEL
Anion gap: 13 (ref 5–15)
BUN: 13 mg/dL (ref 6–20)
CO2: 21 mmol/L — ABNORMAL LOW (ref 22–32)
Calcium: 8.5 mg/dL — ABNORMAL LOW (ref 8.9–10.3)
Chloride: 109 mmol/L (ref 98–111)
Creatinine, Ser: 0.81 mg/dL (ref 0.44–1.00)
GFR, Estimated: >60 mL/min (ref >60)
Glucose, Bld: 83 mg/dL (ref 70–99)
Potassium: 4.1 mmol/L (ref 3.5–5.1)
Sodium: 143 mmol/L (ref 135–145)

## 2023-12-28 LAB — CBC
HCT: 39.3 % (ref 36.0–46.0)
Hemoglobin: 13 g/dL (ref 12.0–15.0)
MCH: 29.1 pg (ref 26.0–34.0)
MCHC: 33.1 g/dL (ref 30.0–36.0)
MCV: 87.9 fL (ref 80.0–100.0)
Platelets: 270 10*3/uL (ref 150–400)
RBC: 4.47 MIL/uL (ref 3.87–5.11)
RDW: 13.5 % (ref 11.5–15.5)
WBC: 8.5 10*3/uL (ref 4.0–10.5)
nRBC: 0 % (ref 0.0–0.2)

## 2023-12-28 LAB — SURGICAL PCR SCREEN
MRSA, PCR: NEGATIVE
Staphylococcus aureus: NEGATIVE

## 2023-12-28 LAB — POC URINE PREG, ED: Preg Test, Ur: NEGATIVE

## 2023-12-28 SURGERY — OPEN REDUCTION INTERNAL FIXATION (ORIF) ANKLE FRACTURE
Anesthesia: General | Site: Ankle | Laterality: Right

## 2023-12-28 MED ORDER — ASPIRIN 81 MG PO CHEW: 81.0000 mg | CHEWABLE_TABLET | Freq: Two times a day (BID) | ORAL | 0 refills | Status: DC

## 2023-12-28 MED ORDER — BUPIVACAINE HCL 0.25 % IJ SOLN
INTRAMUSCULAR | Status: DC | PRN
  Administered 2023-12-28: 20 mL

## 2023-12-28 MED ORDER — BUPIVACAINE HCL (PF) 0.25 % IJ SOLN
INTRAMUSCULAR | Status: AC
  Filled 2023-12-28: qty 30

## 2023-12-28 MED ORDER — VANCOMYCIN HCL 1000 MG IV SOLR
INTRAVENOUS | Status: DC | PRN
  Administered 2023-12-28: 1000 mg via TOPICAL

## 2023-12-28 MED ORDER — HYDROMORPHONE HCL 1 MG/ML IJ SOLN
0.2500 mg | INTRAMUSCULAR | Status: DC | PRN
  Administered 2023-12-28 (×2): 0.25 mg via INTRAVENOUS

## 2023-12-28 MED ORDER — SUGAMMADEX SODIUM 200 MG/2ML IV SOLN
INTRAVENOUS | Status: AC
  Filled 2023-12-28: qty 2

## 2023-12-28 MED ORDER — ORAL CARE MOUTH RINSE: 15.0000 mL | Freq: Once | OROMUCOSAL | Status: AC

## 2023-12-28 MED ORDER — LACTATED RINGERS IV SOLN: INTRAVENOUS | Status: DC

## 2023-12-28 MED ORDER — KETAMINE HCL 10 MG/ML IJ SOLN
INTRAMUSCULAR | Status: DC | PRN
  Administered 2023-12-28: 30 mg via INTRAVENOUS

## 2023-12-28 MED ORDER — 0.9 % SODIUM CHLORIDE (POUR BTL) OPTIME
TOPICAL | Status: DC | PRN
  Administered 2023-12-28: 1000 mL

## 2023-12-28 MED ORDER — ONDANSETRON HCL 4 MG PO TABS: 4.0000 mg | ORAL_TABLET | Freq: Three times a day (TID) | ORAL | 0 refills | Status: DC | PRN

## 2023-12-28 MED ORDER — ACETAMINOPHEN 10 MG/ML IV SOLN
INTRAVENOUS | Status: AC
  Filled 2023-12-28: qty 100

## 2023-12-28 MED ORDER — OXYCODONE HCL 5 MG PO TABS: ORAL_TABLET | ORAL | 0 refills | Status: AC

## 2023-12-28 MED ORDER — ROCURONIUM BROMIDE 10 MG/ML (PF) SYRINGE
PREFILLED_SYRINGE | INTRAVENOUS | Status: AC
  Filled 2023-12-28: qty 10

## 2023-12-28 MED ORDER — SUGAMMADEX SODIUM 200 MG/2ML IV SOLN
INTRAVENOUS | Status: DC | PRN
  Administered 2023-12-28: 200 mg via INTRAVENOUS

## 2023-12-28 MED ORDER — ONDANSETRON HCL 4 MG/2ML IJ SOLN
INTRAMUSCULAR | Status: DC | PRN
  Administered 2023-12-28: 4 mg via INTRAVENOUS

## 2023-12-28 MED ORDER — CEFAZOLIN SODIUM-DEXTROSE 2-4 GM/100ML-% IV SOLN
INTRAVENOUS | Status: AC
  Filled 2023-12-28: qty 100

## 2023-12-28 MED ORDER — PROPOFOL 10 MG/ML IV BOLUS
INTRAVENOUS | Status: AC | PRN
Start: 2023-12-28 — End: 2023-12-28
  Administered 2023-12-28: 30 mg via INTRAVENOUS

## 2023-12-28 MED ORDER — PROPOFOL 10 MG/ML IV BOLUS
INTRAVENOUS | Status: AC
  Filled 2023-12-28: qty 20

## 2023-12-28 MED ORDER — KETOROLAC TROMETHAMINE 30 MG/ML IJ SOLN
INTRAMUSCULAR | Status: AC
  Filled 2023-12-28: qty 1

## 2023-12-28 MED ORDER — LIDOCAINE 2% (20 MG/ML) 5 ML SYRINGE
INTRAMUSCULAR | Status: AC
  Filled 2023-12-28: qty 10

## 2023-12-28 MED ORDER — KETOROLAC TROMETHAMINE 30 MG/ML IJ SOLN
INTRAMUSCULAR | Status: DC | PRN
  Administered 2023-12-28: 30 mg via INTRAVENOUS

## 2023-12-28 MED ORDER — KETAMINE HCL 50 MG/5ML IJ SOSY
PREFILLED_SYRINGE | INTRAMUSCULAR | Status: AC
Start: 2023-12-28 — End: ?
  Filled 2023-12-28: qty 5

## 2023-12-28 MED ORDER — PROPOFOL 10 MG/ML IV BOLUS
INTRAVENOUS | Status: AC | PRN
Start: 2023-12-28 — End: 2023-12-28
  Administered 2023-12-28: 60 mg via INTRAVENOUS

## 2023-12-28 MED ORDER — ROCURONIUM BROMIDE 10 MG/ML (PF) SYRINGE
PREFILLED_SYRINGE | INTRAVENOUS | Status: DC | PRN
  Administered 2023-12-28: 50 mg via INTRAVENOUS

## 2023-12-28 MED ORDER — MIDAZOLAM HCL 2 MG/2ML IJ SOLN
INTRAMUSCULAR | Status: DC | PRN
  Administered 2023-12-28: 2 mg via INTRAVENOUS

## 2023-12-28 MED ORDER — OXYCODONE HCL 5 MG PO TABS: ORAL_TABLET | ORAL | 0 refills | Status: DC

## 2023-12-28 MED ORDER — MORPHINE SULFATE (PF) 4 MG/ML IV SOLN
4.0000 mg | Freq: Once | INTRAVENOUS | Status: AC
  Administered 2023-12-28: 4 mg via INTRAVENOUS
  Filled 2023-12-28: qty 1

## 2023-12-28 MED ORDER — AMISULPRIDE (ANTIEMETIC) 5 MG/2ML IV SOLN: 10.0000 mg | Freq: Once | INTRAVENOUS | Status: DC | PRN

## 2023-12-28 MED ORDER — DEXAMETHASONE SODIUM PHOSPHATE 10 MG/ML IJ SOLN
INTRAMUSCULAR | Status: DC | PRN
  Administered 2023-12-28: 10 mg via INTRAVENOUS

## 2023-12-28 MED ORDER — MORPHINE SULFATE (PF) 4 MG/ML IV SOLN
6.0000 mg | Freq: Once | INTRAVENOUS | Status: AC
  Administered 2023-12-28: 6 mg via SUBCUTANEOUS
  Filled 2023-12-28: qty 2

## 2023-12-28 MED ORDER — LIDOCAINE 2% (20 MG/ML) 5 ML SYRINGE
INTRAMUSCULAR | Status: DC | PRN
  Administered 2023-12-28: 60 mg via INTRAVENOUS

## 2023-12-28 MED ORDER — ACETAMINOPHEN 500 MG PO TABS: 1000.0000 mg | ORAL_TABLET | Freq: Three times a day (TID) | ORAL | 0 refills | Status: DC

## 2023-12-28 MED ORDER — ONDANSETRON HCL 4 MG/2ML IJ SOLN: 4.0000 mg | Freq: Once | INTRAMUSCULAR | Status: DC | PRN

## 2023-12-28 MED ORDER — PROPOFOL 10 MG/ML IV BOLUS: 60.0000 mg | Freq: Once | INTRAVENOUS | Status: DC

## 2023-12-28 MED ORDER — MELOXICAM 7.5 MG PO TABS: 7.5000 mg | ORAL_TABLET | Freq: Two times a day (BID) | ORAL | 0 refills | Status: AC

## 2023-12-28 MED ORDER — ASPIRIN 81 MG PO CHEW: 81.0000 mg | CHEWABLE_TABLET | Freq: Two times a day (BID) | ORAL | 0 refills | Status: AC

## 2023-12-28 MED ORDER — FENTANYL CITRATE PF 50 MCG/ML IJ SOSY
50.0000 ug | PREFILLED_SYRINGE | Freq: Once | INTRAMUSCULAR | Status: DC
  Filled 2023-12-28: qty 1

## 2023-12-28 MED ORDER — MELOXICAM 7.5 MG PO TABS: 7.5000 mg | ORAL_TABLET | Freq: Two times a day (BID) | ORAL | 0 refills | Status: DC

## 2023-12-28 MED ORDER — HYDROMORPHONE HCL 1 MG/ML IJ SOLN
INTRAMUSCULAR | Status: DC
Start: 2023-12-28 — End: 2023-12-29
  Filled 2023-12-28: qty 1

## 2023-12-28 MED ORDER — MIDAZOLAM HCL 2 MG/2ML IJ SOLN
INTRAMUSCULAR | Status: AC
  Filled 2023-12-28: qty 2

## 2023-12-28 MED ORDER — ACETAMINOPHEN 500 MG PO TABS: 1000.0000 mg | ORAL_TABLET | Freq: Three times a day (TID) | ORAL | 0 refills | Status: AC

## 2023-12-28 MED ORDER — CHLORHEXIDINE GLUCONATE 0.12 % MT SOLN
OROMUCOSAL | Status: AC
  Administered 2023-12-28: 15 mL via OROMUCOSAL
  Filled 2023-12-28: qty 15

## 2023-12-28 MED ORDER — PROPOFOL 10 MG/ML IV BOLUS
INTRAVENOUS | Status: DC | PRN
  Administered 2023-12-28: 140 mg via INTRAVENOUS

## 2023-12-28 MED ORDER — VANCOMYCIN HCL 1000 MG IV SOLR
INTRAVENOUS | Status: AC
  Filled 2023-12-28: qty 20

## 2023-12-28 MED ORDER — CHLORHEXIDINE GLUCONATE 0.12 % MT SOLN
15.0000 mL | Freq: Once | OROMUCOSAL | Status: AC
  Filled 2023-12-28: qty 15

## 2023-12-28 MED ORDER — FENTANYL CITRATE PF 50 MCG/ML IJ SOSY
PREFILLED_SYRINGE | INTRAMUSCULAR | Status: AC
  Filled 2023-12-28: qty 1

## 2023-12-28 MED ORDER — FENTANYL CITRATE PF 50 MCG/ML IJ SOSY
50.0000 ug | PREFILLED_SYRINGE | Freq: Once | INTRAMUSCULAR | Status: AC
  Administered 2023-12-28: 50 ug via INTRAVENOUS

## 2023-12-28 MED ORDER — FENTANYL CITRATE (PF) 100 MCG/2ML IJ SOLN
INTRAMUSCULAR | Status: AC | PRN
Start: 2023-12-28 — End: 2023-12-28
  Administered 2023-12-28: 50 ug via INTRAVENOUS

## 2023-12-28 MED ORDER — CEFAZOLIN SODIUM-DEXTROSE 2-4 GM/100ML-% IV SOLN
2.0000 g | INTRAVENOUS | Status: AC
  Administered 2023-12-28: 2 g via INTRAVENOUS

## 2023-12-28 MED ORDER — ONDANSETRON HCL 4 MG PO TABS: 4.0000 mg | ORAL_TABLET | Freq: Three times a day (TID) | ORAL | 0 refills | Status: AC | PRN

## 2023-12-28 MED ORDER — FENTANYL CITRATE (PF) 250 MCG/5ML IJ SOLN
INTRAMUSCULAR | Status: DC | PRN
  Administered 2023-12-28: 50 ug via INTRAVENOUS

## 2023-12-28 MED ORDER — ACETAMINOPHEN 10 MG/ML IV SOLN
INTRAVENOUS | Status: DC | PRN
  Administered 2023-12-28: 1000 mg via INTRAVENOUS

## 2023-12-28 MED ORDER — FENTANYL CITRATE (PF) 250 MCG/5ML IJ SOLN
INTRAMUSCULAR | Status: AC
  Filled 2023-12-28: qty 5

## 2023-12-28 MED ORDER — ONDANSETRON HCL 4 MG/2ML IJ SOLN
INTRAMUSCULAR | Status: AC
  Filled 2023-12-28: qty 8

## 2023-12-28 MED ORDER — DEXAMETHASONE SODIUM PHOSPHATE 10 MG/ML IJ SOLN
INTRAMUSCULAR | Status: AC
  Filled 2023-12-28: qty 4

## 2023-12-28 SURGICAL SUPPLY — 67 items
BAG COUNTER SPONGE SURGICOUNT (BAG) IMPLANT
BENZOIN TINCTURE PRP APPL 2/3 (GAUZE/BANDAGES/DRESSINGS) IMPLANT
BIT DRILL 2 CANN GRADUATED (BIT) IMPLANT
BIT DRILL 2.5 CANN LNG (BIT) IMPLANT
BIT DRILL 2.5 CANN STRL (BIT) IMPLANT
BIT DRILL 2.6 CANN (BIT) IMPLANT
BIT DRILL 3 CANN ENDOSCOPIC (BIT) IMPLANT
BLADE SURG 10 STRL SS (BLADE) ×1 IMPLANT
BLADE SURG 15 STRL LF DISP TIS (BLADE) ×2 IMPLANT
BNDG COHESIVE 4X5 TAN STRL LF (GAUZE/BANDAGES/DRESSINGS) ×1 IMPLANT
BNDG ELASTIC 4INX 5YD STR LF (GAUZE/BANDAGES/DRESSINGS) IMPLANT
BNDG ELASTIC 4X5.8 VLCR STR LF (GAUZE/BANDAGES/DRESSINGS) ×1 IMPLANT
BNDG ELASTIC 6INX 5YD STR LF (GAUZE/BANDAGES/DRESSINGS) ×1 IMPLANT
BNDG ELASTIC 6X15 VLCR STRL LF (GAUZE/BANDAGES/DRESSINGS) IMPLANT
CUFF TRNQT CYL 24X4X16.5-23 (TOURNIQUET CUFF) IMPLANT
CUFF TRNQT CYL 34X4.125X (TOURNIQUET CUFF) IMPLANT
DRAPE INCISE IOBAN 66X45 STRL (DRAPES) ×1 IMPLANT
DRAPE OEC MINIVIEW 54X84 (DRAPES) ×1 IMPLANT
DRAPE U-SHAPE 47X51 STRL (DRAPES) ×1 IMPLANT
DURAPREP 26ML APPLICATOR (WOUND CARE) ×1 IMPLANT
ELECT REM PT RETURN 9FT ADLT (ELECTROSURGICAL) ×1 IMPLANT
ELECTRODE REM PT RTRN 9FT ADLT (ELECTROSURGICAL) ×1 IMPLANT
GAUZE PAD ABD 8X10 STRL (GAUZE/BANDAGES/DRESSINGS) ×1 IMPLANT
GAUZE SPONGE 4X4 12PLY STRL (GAUZE/BANDAGES/DRESSINGS) ×1 IMPLANT
GAUZE SPONGE 4X4 12PLY STRL LF (GAUZE/BANDAGES/DRESSINGS) IMPLANT
GAUZE XEROFORM 5X9 LF (GAUZE/BANDAGES/DRESSINGS) IMPLANT
GLOVE BIO SURGEON STRL SZ 6.5 (GLOVE) ×1 IMPLANT
GLOVE BIO SURGEON STRL SZ8 (GLOVE) ×1 IMPLANT
GLOVE BIOGEL PI IND STRL 8 (GLOVE) ×1 IMPLANT
GLOVE ECLIPSE 8.0 STRL XLNG CF (GLOVE) ×1 IMPLANT
GLOVE INDICATOR 6.5 STRL GRN (GLOVE) ×1 IMPLANT
GOWN STRL REUS W/ TWL LRG LVL3 (GOWN DISPOSABLE) ×1 IMPLANT
GOWN STRL REUS W/ TWL XL LVL3 (GOWN DISPOSABLE) ×1 IMPLANT
GUIDEWIRE 1.35MM (WIRE) IMPLANT
KIT BASIN OR (CUSTOM PROCEDURE TRAY) ×1 IMPLANT
NDL HYPO 22X1.5 SAFETY MO (MISCELLANEOUS) IMPLANT
NEEDLE HYPO 22X1.5 SAFETY MO (MISCELLANEOUS) IMPLANT
NS IRRIG 1000ML POUR BTL (IV SOLUTION) ×1 IMPLANT
PACK ORTHO EXTREMITY (CUSTOM PROCEDURE TRAY) ×1 IMPLANT
PAD CAST 4YDX4 CTTN HI CHSV (CAST SUPPLIES) ×1 IMPLANT
PADDING CAST COTTON 6X4 STRL (CAST SUPPLIES) ×1 IMPLANT
PADDING CAST SYNTHETIC 3X4 NS (CAST SUPPLIES) IMPLANT
PLATE LOCK DIST FIB RT 5H (Plate) IMPLANT
SCREW CORTICAL 3MMX16MM (Screw) IMPLANT
SCREW KREULOCK 3.0X10 (Screw) IMPLANT
SCREW LOCK COMP 3X16 (Screw) IMPLANT
SCREW LOCK COMPR LP 3.5X12 (Screw) IMPLANT
SCREW LP TI 3.5X14MM (Screw) IMPLANT
SCREW QCKFIX CANN 4.0X40MM (Screw) IMPLANT
SCREW VAL KREULOCK 3.0X12 TI (Screw) IMPLANT
SCREW VAL KREULOCK 3.0X14 TI (Screw) IMPLANT
SLEEVE SCD COMPRESS KNEE MED (STOCKING) IMPLANT
SPIKE FLUID TRANSFER (MISCELLANEOUS) IMPLANT
SPLINT PLASTER CAST XFAST 5X30 (CAST SUPPLIES) IMPLANT
STRIP CLOSURE SKIN 1/2X4 (GAUZE/BANDAGES/DRESSINGS) ×1 IMPLANT
SUCTION TUBE FRAZIER 10FR DISP (SUCTIONS) ×1 IMPLANT
SUT ETHILON 2 0 FS 18 (SUTURE) IMPLANT
SUT MNCRL AB 4-0 PS2 18 (SUTURE) IMPLANT
SUT MON AB 3-0 SH27 (SUTURE) IMPLANT
SUT VIC AB 0 CT1 27XBRD ANBCTR (SUTURE) ×1 IMPLANT
SUT VIC AB 3-0 SH 27X BRD (SUTURE) ×1 IMPLANT
SYNDESMOSIS TIGHTROPE XP (Orthopedic Implant) IMPLANT
SYR CONTROL 10ML LL (SYRINGE) IMPLANT
TOWEL GREEN STERILE FF (TOWEL DISPOSABLE) ×2 IMPLANT
TUBE CONNECTING 20X1/4 (TUBING) ×1 IMPLANT
UNDERPAD 30X36 HEAVY ABSORB (UNDERPADS AND DIAPERS) ×1 IMPLANT
YANKAUER SUCT BULB TIP NO VENT (SUCTIONS) ×1 IMPLANT

## 2023-12-28 NOTE — ED Triage Notes (Signed)
 Pt bib ems from home c/o fall. Pt fell last night around 3 am. Pt has broken right ankle with numbness and tingling. Pt is able to feel sensation. Pt endorses nausea.  BP 118/90 HR 115 RR 16 RA 98%

## 2023-12-28 NOTE — Discharge Instructions (Signed)
 Ramond Marrow MD, MPH [facility] [address], [address] [phone] (tel)   [phone] (fax)   POST-OPERATIVE INSTRUCTIONS - LOWER EXTREMITY   WOUND CARE Please keep splint clean dry and intact until followup.  You may shower on Post-Op Day #3.  You must keep splint dry during this process and may find that a plastic bag taped around the leg or alternatively a towel based bath may be a better option.   If you get your splint wet or if it is damaged please contact our clinic.  EXERCISES Due to your splint being in place you will not be able to bear weight through your extremity.   DO NOT PUT ANY WEIGHT ON YOUR OPERATIVE LEG Please use crutches or a walker to avoid weight bearing.   REGIONAL ANESTHESIA (NERVE BLOCKS) The anesthesia team may have performed a nerve block for you this is a great tool used to minimize pain.   The block may start wearing off overnight (between 8-24 hours postop) When the block wears off, your pain may go from nearly zero to the pain you would have had postop without the block. This is an abrupt transition but nothing dangerous is happening.   This can be a challenging period but utilize your as needed pain medications to try and manage this period. We suggest you use the pain medication the first night prior to going to bed, to ease this transition.  You may take an extra dose of narcotic when this happens if needed   POST-OP MEDICATIONS- Multimodal approach to pain control In general your pain will be controlled with a combination of substances.  Prescriptions unless otherwise discussed are electronically sent to your pharmacy.  This is a carefully made plan we use to minimize narcotic use.     Meloxicam - Anti-inflammatory medication taken on a scheduled basis Acetaminophen - Non-narcotic pain medicine taken on a scheduled basis  Oxycodone - This is a strong narcotic, to be used only on an "as needed" basis for SEVERE  pain. Aspirin 81mg  - This medicine is used to minimize the risk of blood clots after surgery. Zofran - take as needed for nausea   FOLLOW-UP If you develop a Fever (>101.5), Redness or Drainage from the surgical incision site, please call our office to arrange for an evaluation. Please call the office to schedule a follow-up appointment for your incision check if you do not already have one, 7-10 days post-operatively.  IF YOU HAVE ANY QUESTIONS, PLEASE FEEL FREE TO CALL OUR OFFICE.  HELPFUL INFORMATION  If you had a block, it will wear off between 8-24 hrs postop typically.  This is period when your pain may go from nearly zero to the pain you would have had postop without the block.  This is an abrupt transition but nothing dangerous is happening.  You may take an extra dose of narcotic when this happens.  You should wean off your narcotic medicines as soon as you are able.  Most patients will be off narcotics before their first postop appointment.   We suggest you use the pain medication the first night prior to going to bed, in order to ease any pain when the anesthesia wears off. You should avoid taking pain medications on an empty stomach as it will make you nauseous.  Do not drink alcoholic beverages or take illicit drugs when taking pain medications.  In most states it is against the law to drive while you are in a  splint or sling.  And certainly against the law to drive while taking narcotics.  You may return to work/school in the next couple of days when you feel up to it.   Pain medication may make you constipated.  Below are a few solutions to try in this order: Decrease the amount of pain medication if you aren't having pain. Drink lots of decaffeinated fluids. Drink prune juice and/or each dried prunes  If the first 3 don't work start with additional solutions Take Colace - an over-the-counter stool softener Take Senokot - an over-the-counter laxative Take Miralax - a  stronger over-the-counter laxative  For more information including helpful videos and documents visit our website:   [email or web address]

## 2023-12-28 NOTE — ED Provider Triage Note (Signed)
 Emergency Medicine Provider Triage Evaluation Note  Darlene Ruiz , a 29 y.o. female  was evaluated in triage.  Pt complains of right foot and ankle pain after mechanical fall just prior to arrival.  No head injury noted..  Review of Systems  Positive: Unable to bear weight Negative: No hip or knee pain  Physical Exam  BP 122/87   Pulse (!) 104   Temp 98.7 F (37.1 C) (Oral)   Resp (!) 21   Ht 1.422 m (4\' 8" )   Wt 63.5 kg   SpO2 100%   BMI 31.39 kg/m    Medical Decision Making  Medically screening exam initiated at 11:48 AM.  Appropriate orders placed.  Wyn Forster was informed that the remainder of the evaluation will be completed by another provider, this initial triage assessment does not replace that evaluation, and the importance of remaining in the ED until their evaluation is complete.     Lorre Nick, MD 12/28/23 1149

## 2023-12-28 NOTE — Op Note (Signed)
 Orthopaedic Surgery Operative Note (CSN: [id number])  Darlene Ruiz  08-18-1996 Date of Surgery: 12/28/2023   Diagnoses:  right trimalleolar fracture***  Procedure: ***   Operative Finding Successful completion of the planned procedure.  ***  Post-operative plan: The patient will be ***.  The patient will be ***.  DVT prophylaxis {DTV DVT prophylaxis:23223}.   Pain control with PRN pain medication preferring oral medicines.  Follow up plan will be scheduled in approximately 7*** days for incision check and XR***.  Post-Op Diagnosis: Same Surgeons:Primary: Bjorn Pippin, MD Assistants:{dtv pa list only:31631} Location: [facility] OR ROOM 08 Anesthesia: {dtv anesthesia type:23224} Antibiotics: {dtv operative antibiotics:23225} Tourniquet time: * Missing tourniquet times found for documented tourniquets in log: [id number] **** Estimated Blood Loss: Minimal*** Complications: None*** Specimens: None*** Implants: Implant Name Type Inv. Item Serial No. Manufacturer Lot No. LRB No. Used Action  SYNDESMOSIS Arvilla Meres - [id number] Orthopedic Implant SYNDESMOSIS TIGHTROPE XP [id number] [employer] [id number] Right 1 Implanted    Indications for Surgery:   Darlene Ruiz is a 28 y.o. female with ***.  ***Benefits and risks of operative and nonoperative management were discussed prior to surgery with patient/guardian(s) and informed consent form was completed.  Specific risks including infection, need for additional surgery, ***   Procedure:   The patient was identified properly. Informed consent was obtained and the surgical site was marked. The patient was taken up to suite where general anesthesia was induced.  The patient was positioned ***.  The {RIGHT/LEFT:20294} ***was prepped and draped in the usual sterile fashion.  Timeout was performed before the beginning of the case.  Tourniquet was used for the above duration.***  ***  We irrigated the wound copiously before placing local antibiotic as  listed above***.  We closed the incision in a multilayer fashion with absorbable suture.  Sterile dressing was placed.  ***Patient was awoken taken to PACU in stable condition.  {DTV PA list:31628}

## 2023-12-28 NOTE — Anesthesia Procedure Notes (Signed)
 Procedure Name: Intubation Date/Time: 12/28/2023 6:42 PM  Performed by: Gwenyth Allegra, CRNAPre-anesthesia Checklist: Patient identified, Emergency Drugs available, Suction available, Patient being monitored and Timeout performed Patient Re-evaluated:Patient Re-evaluated prior to induction Oxygen Delivery Method: Circle system utilized Preoxygenation: Pre-oxygenation with 100% oxygen Induction Type: IV induction Ventilation: Mask ventilation without difficulty Laryngoscope Size: Mac and 3 Grade View: Grade I Tube type: Oral Tube size: 7.0 mm Number of attempts: 1 Airway Equipment and Method: Stylet Placement Confirmation: ETT inserted through vocal cords under direct vision, positive ETCO2 and breath sounds checked- equal and bilateral Secured at: 21 cm Tube secured with: Tape Dental Injury: Teeth and Oropharynx as per pre-operative assessment

## 2023-12-28 NOTE — Progress Notes (Signed)
 Orthopedic Tech Progress Note Patient Details:  Darlene Ruiz Dec 14, 1995 [id number]  Ortho Devices Type of Ortho Device: Crutches Ortho Device/Splint Interventions: Ordered, Application, Adjustment   Post Interventions Patient Tolerated: Well Instructions Provided: Care of device, Adjustment of device  Trinna Post 12/28/2023, 10:07 PM

## 2023-12-28 NOTE — H&P (View-Only) (Signed)
 ORTHOPAEDIC CONSULTATION  REQUESTING PHYSICIAN: No att. providers found  Chief Complaint: Right mall reduced ankle fracture  HPI: Darlene Ruiz is a 28 y.o. female with right ankle fracture that despite ER reduction was still mall reduced.  She is otherwise healthy.  This happened earlier today with a ground-level fall.  She does have a history of lupus.  Of note of taking care of her stepfather Cindie Laroche.  Past Medical History:  Diagnosis Date   Hypertension    Lupus    Renal disorder    Past Surgical History:  Procedure Laterality Date   RENAL BIOPSY     Social History   Socioeconomic History   Marital status: Single    Spouse name: Not on file   Number of children: Not on file   Years of education: Not on file   Highest education level: Not on file  Occupational History   Not on file  Tobacco Use   Smoking status: Never   Smokeless tobacco: Never  Vaping Use   Vaping status: Every Day  Substance and Sexual Activity   Alcohol use: Yes    Comment: social   Drug use: No   Sexual activity: Not on file  Other Topics Concern   Not on file  Social History Narrative   Not on file   Social Drivers of Health   Financial Resource Strain: Not on file  Food Insecurity: Not on file  Transportation Needs: Not on file  Physical Activity: Not on file  Stress: Not on file  Social Connections: Unknown (07/25/2022)   [facility] from [facility], [facility]   Social Network    Social Network: Not on file   Family History  Problem Relation Age of Onset   Hypertension Other    Allergies  Allergen Reactions   Tamiflu [Oseltamivir] Shortness Of Breath   Amoxicillin Nausea And Vomiting and Rash   Prior to Admission medications   Medication Sig Start Date End Date Taking? Authorizing Provider  azaTHIOprine (IMURAN) 50 MG tablet Take 100 mg by mouth daily. 11/26/16  Yes [provider]  hydroxychloroquine (PLAQUENIL) 200 MG tablet Take 300 mg by mouth  daily.   Yes [provider]  carisoprodol (SOMA) 350 MG tablet Take 1 tablet (350 mg total) by mouth 3 (three) times daily as needed for muscle spasms. 01/07/22   Redwine, Madison A, PA-C  desonide (DESOWEN) 0.05 % ointment Apply 1 application topically daily.    [provider]  fluocinonide ointment (LIDEX) 0.05 % Apply 1 application topically See admin instructions. Apply 2 times a day to affected areas of lupus. Strict sun protection.  Stop when smooth. 03/25/19   [provider]  lisinopril (PRINIVIL,ZESTRIL) 10 MG tablet Take 10 mg by mouth daily. Patient not taking: Reported on 12/28/2023    [provider]  medroxyPROGESTERone (DEPO-PROVERA) 150 MG/ML injection Inject 150 mg into the muscle every 3 (three) months. 11/08/20   [provider]  predniSONE (DELTASONE) 20 MG tablet Take 2 tablets (40 mg total) by mouth daily. 11/29/22   Achille Rich, PA-C  valACYclovir (VALTREX) 1000 MG tablet Take 1,000 mg by mouth daily as needed (OB).    [provider]   DG MINI C-ARM IMAGE ONLY Result Date: 12/28/2023 There is no interpretation for this exam.  This order is for images obtained during a surgical procedure.  Please See "Surgeries" Tab for more information regarding the procedure.   DG Ankle Complete Right Result Date:  12/28/2023 CLINICAL DATA:  Post reduction films. EXAM: RIGHT ANKLE - COMPLETE 3+ VIEW COMPARISON:  December 28, 2023 (12:24 p.m.) FINDINGS: The right ankle was imaged in a plaster cast with subsequently obscured osseous and soft tissue detail. Acute fractures of the right lateral malleolus and right medial malleolus are noted with gross anatomic alignment. Mild disruption of the right ankle mortise is noted. Mild diffuse soft tissue swelling is suspected. IMPRESSION: Status post reduction of acute fractures of the right lateral malleolus and right medial malleolus. Electronically Signed   By: Aram Candela M.D.   On: 12/28/2023  17:46   DG Ankle Complete Right Result Date: 12/28/2023 CLINICAL DATA:  Status post fall with ankle fracture EXAM: RIGHT ANKLE - COMPLETE 3 VIEW; RIGHT FOOT COMPLETE - 3 VIEW COMPARISON:  None Available. FINDINGS: Comminuted fracture of the distal fibular diaphysis with 1.5 cm foreshortening and 1/2 shaft width lateral displacement. Fractures of the medial malleolus with 1 cm lateral displacement. There is disruption of the ankle mortise and the talus is laterally displaced relative to the distal tibia and fibula. Moderate joint effusion. Diffuse soft tissue swelling about the ankle. IMPRESSION: Comminuted fracture of the distal fibular diaphysis and displaced fracture of the medial malleolus with disruption of the ankle mortise. Electronically Signed   By: Agustin Cree M.D.   On: 12/28/2023 14:27   DG Foot Complete Right Result Date: 12/28/2023 CLINICAL DATA:  Status post fall with ankle fracture EXAM: RIGHT ANKLE - COMPLETE 3 VIEW; RIGHT FOOT COMPLETE - 3 VIEW COMPARISON:  None Available. FINDINGS: Comminuted fracture of the distal fibular diaphysis with 1.5 cm foreshortening and 1/2 shaft width lateral displacement. Fractures of the medial malleolus with 1 cm lateral displacement. There is disruption of the ankle mortise and the talus is laterally displaced relative to the distal tibia and fibula. Moderate joint effusion. Diffuse soft tissue swelling about the ankle. IMPRESSION: Comminuted fracture of the distal fibular diaphysis and displaced fracture of the medial malleolus with disruption of the ankle mortise. Electronically Signed   By: Agustin Cree M.D.   On: 12/28/2023 14:27   Family History Reviewed and non-contributory, no pertinent history of problems with bleeding or anesthesia      Review of Systems 14 system ROS conducted and negative except for that noted in HPI   OBJECTIVE  Vitals:Patient Vitals for the past 8 hrs:  BP Temp Temp src Pulse Resp SpO2 Height Weight  12/28/23 1700 (!)  137/91 -- -- -- 19 99 % -- --  12/28/23 1645 (!) 132/94 -- -- 97 (!) 25 100 % -- --  12/28/23 1630 129/71 -- -- (!) 105 19 100 % -- --  12/28/23 1615 110/83 -- -- (!) 104 15 100 % -- --  12/28/23 1610 119/67 -- -- 100 (!) 24 100 % -- --  12/28/23 1606 117/84 -- -- (!) 114 13 100 % -- --  12/28/23 1559 112/65 -- -- (!) 136 20 100 % -- --  12/28/23 1552 (!) 140/106 -- -- (!) 113 (!) 21 100 % -- --  12/28/23 1537 (!) 129/91 98.8 F (37.1 C) -- (!) 101 18 99 % -- --  12/28/23 1141 -- -- -- -- -- -- 4\' 8"  (1.422 m) 63.5 kg  12/28/23 1139 122/87 98.7 F (37.1 C) Oral (!) 104 (!) 21 100 % -- --   General: Alert, no acute distress Cardiovascular: Warm extremities noted Respiratory: No cyanosis, no use of accessory musculature GI: No organomegaly, abdomen is soft and non-tender  Skin: No lesions in the area of chief complaint other than those listed below in MSK exam.  Neurologic: Sensation intact distally save for the below mentioned MSK exam Psychiatric: Patient is competent for consent with normal mood and affect Lymphatic: No swelling obvious and reported other than the area involved in the exam below Extremities  Right lower extremity: Splint in place, wiggling toes, warm perfused foot, cannot check skin due to splint.    Test Results Imaging X-rays demonstrate a laterally displaced talus in the setting of a trimalleolar ankle fracture residual after reduction.  Labs cbc Recent Labs    12/28/23 1720  WBC 8.5  HGB 13.0  HCT 39.3  PLT 270    Labs inflam No results for input(s): "CRP" in the last 72 hours.  Invalid input(s): "ESR"  Labs coag No results for input(s): "INR", "PTT" in the last 72 hours.  Invalid input(s): "PT"  Recent Labs    12/28/23 1720  NA 143  K 4.1  CL 109  CO2 21*  GLUCOSE 83  BUN 13  CREATININE 0.81  CALCIUM 8.5*     ASSESSMENT AND PLAN: 28 y.o. female with the following: Trimalleolar ankle fracture with malreduction.  Unfortunately due  to the displaced unstable nature of this fracture of the surgical.  Based on her malreduction I felt that it was appropriate in this young healthy patient to try and obtain appropriate anatomic fixation tonight to avoid further skin trauma.  Risks and  benefits alternatives were discussed with the patient and her mother on the phone.  All questions were answered.  Specific risks of infection, hardware failure and need for external fixation were discussed.  Patient will be discharged postoperatively.

## 2023-12-28 NOTE — ED Provider Notes (Signed)
 [place] EMERGENCY DEPARTMENT AT [facility] Provider Note   CSN: [id number] Arrival date & time: 12/28/23  1120     History  Chief Complaint  Patient presents with   Ankle Injury    Darlene Ruiz is a 28 y.o. female.  HPI Patient presents hours after sustaining an injury to her right ankle. She notes that she slipped going downstairs, sustained an injury to her right ankle, has no pain in her hip, knee, or anywhere else.  She was well prior to the event.  Since the event she has been unable to walk secondary to pain in that area.    Home Medications Prior to Admission medications   Medication Sig Start Date End Date Taking? Authorizing Provider  azaTHIOprine (IMURAN) 50 MG tablet Take 100 mg by mouth daily. 11/26/16   [provider]  carisoprodol (SOMA) 350 MG tablet Take 1 tablet (350 mg total) by mouth 3 (three) times daily as needed for muscle spasms. 01/07/22   Redwine, Madison A, PA-C  cephALEXin (KEFLEX) 500 MG capsule Take 1 capsule (500 mg total) by mouth 4 (four) times daily. 04/07/23   Blue, Soijett A, PA-C  Cetirizine HCl 10 MG TBDP Take 10 mg by mouth once for 1 dose. 01/07/22 01/07/22  Redwine, Madison A, PA-C  desonide (DESOWEN) 0.05 % ointment Apply 1 application topically daily.    [provider]  doxycycline (VIBRAMYCIN) 100 MG capsule Take 1 capsule (100 mg total) by mouth 2 (two) times daily. One po bid x 7 days 04/06/23   Geoffery Lyons, MD  ENVARSUS XR 4 MG TB24 Take 1 tablet by mouth daily. 10/08/20   [provider]  fluocinonide ointment (LIDEX) 0.05 % Apply 1 application topically See admin instructions. Apply 2 times a day to affected areas of lupus. Strict sun protection.  Stop when smooth. 03/25/19   [provider]  hydroxychloroquine (PLAQUENIL) 200 MG tablet Take 300 mg by mouth daily.    [provider]  lisinopril (PRINIVIL,ZESTRIL) 10 MG tablet Take 10 mg by mouth daily.    [provider]   loratadine (CLARITIN) 10 MG tablet Take 10 mg by mouth daily as needed. allergies    [provider]  medroxyPROGESTERone (DEPO-PROVERA) 150 MG/ML injection Inject 150 mg into the muscle every 3 (three) months. 11/08/20   [provider]  montelukast (SINGULAIR) 10 MG tablet Take 10 mg by mouth daily as needed. allergies    [provider]  ondansetron (ZOFRAN) 4 MG tablet Take 1 tablet (4 mg total) by mouth every 6 (six) hours as needed for nausea. 02/01/21   Drema Dallas, MD  predniSONE (DELTASONE) 20 MG tablet Take 2 tablets (40 mg total) by mouth daily. 11/29/22   Achille Rich, PA-C  valACYclovir (VALTREX) 1000 MG tablet Take 1,000 mg by mouth daily as needed (OB).    [provider]      Allergies    Tamiflu [oseltamivir] and Amoxicillin    Review of Systems   Review of Systems  Physical Exam Updated Vital Signs BP (!) 132/94   Pulse 97   Temp 98.8 F (37.1 C)   Resp (!) 25   Ht 4\' 8"  (1.422 m)   Wt 63.5 kg   SpO2 100%   BMI 31.39 kg/m  Physical Exam Vitals and nursing note reviewed.  Constitutional:      General: She is not in acute distress.    Appearance: She is well-developed.  HENT:  Head: Normocephalic and atraumatic.  Eyes:     Conjunctiva/sclera: Conjunctivae normal.  Cardiovascular:     Rate and Rhythm: Normal rate and regular rhythm.     Pulses: Normal pulses.  Pulmonary:     Effort: Pulmonary effort is normal. No respiratory distress.     Breath sounds: No stridor.  Abdominal:     General: There is no distension.  Musculoskeletal:     Comments: Right knee grossly unremarkable, pelvis grossly unremarkable.  Right ankle with soft immobilization device in place, but gross deformity appreciable.  Patient moves the toes appropriately, unwilling to move the ankle secondary to pain.  Skin:    General: Skin is warm and dry.  Neurological:     Mental Status: She is alert and oriented to person, place, and time.      Cranial Nerves: No cranial nerve deficit.  Psychiatric:        Mood and Affect: Mood normal.     ED Results / Procedures / Treatments   Labs (all labs ordered are listed, but only abnormal results are displayed) Labs Reviewed - No data to display  EKG None  Radiology DG Ankle Complete Right Result Date: 12/28/2023 CLINICAL DATA:  Status post fall with ankle fracture EXAM: RIGHT ANKLE - COMPLETE 3 VIEW; RIGHT FOOT COMPLETE - 3 VIEW COMPARISON:  None Available. FINDINGS: Comminuted fracture of the distal fibular diaphysis with 1.5 cm foreshortening and 1/2 shaft width lateral displacement. Fractures of the medial malleolus with 1 cm lateral displacement. There is disruption of the ankle mortise and the talus is laterally displaced relative to the distal tibia and fibula. Moderate joint effusion. Diffuse soft tissue swelling about the ankle. IMPRESSION: Comminuted fracture of the distal fibular diaphysis and displaced fracture of the medial malleolus with disruption of the ankle mortise. Electronically Signed   By: Agustin Cree M.D.   On: 12/28/2023 14:27   DG Foot Complete Right Result Date: 12/28/2023 CLINICAL DATA:  Status post fall with ankle fracture EXAM: RIGHT ANKLE - COMPLETE 3 VIEW; RIGHT FOOT COMPLETE - 3 VIEW COMPARISON:  None Available. FINDINGS: Comminuted fracture of the distal fibular diaphysis with 1.5 cm foreshortening and 1/2 shaft width lateral displacement. Fractures of the medial malleolus with 1 cm lateral displacement. There is disruption of the ankle mortise and the talus is laterally displaced relative to the distal tibia and fibula. Moderate joint effusion. Diffuse soft tissue swelling about the ankle. IMPRESSION: Comminuted fracture of the distal fibular diaphysis and displaced fracture of the medial malleolus with disruption of the ankle mortise. Electronically Signed   By: Agustin Cree M.D.   On: 12/28/2023 14:27    Procedures .Sedation  Date/Time: 12/28/2023 5:00  PM  Performed by: Gerhard Munch, MD Authorized by: Gerhard Munch, MD   Consent:    Consent obtained:  Verbal   Consent given by:  Patient   Risks discussed:  Allergic reaction, dysrhythmia, inadequate sedation, nausea, prolonged hypoxia resulting in organ damage, prolonged sedation necessitating reversal, respiratory compromise necessitating ventilatory assistance and intubation and vomiting   Alternatives discussed:  Analgesia without sedation, anxiolysis and regional anesthesia Universal protocol:    Procedure explained and questions answered to patient or proxy's satisfaction: yes     Relevant documents present and verified: yes     Test results available: yes     Imaging studies available: yes     Required blood products, implants, devices, and special equipment available: yes     Site/side marked: yes  Immediately prior to procedure, a time out was called: yes     Patient identity confirmed:  Verbally with patient Indications:    Procedure necessitating sedation performed by:  Physician performing sedation Pre-sedation assessment:    Time since last food or drink:  6   ASA classification: class 1 - normal, healthy patient     Mouth opening:  3 or more finger widths   Thyromental distance:  4 finger widths   Mallampati score:  I - soft palate, uvula, fauces, pillars visible   Neck mobility: normal     Pre-sedation assessments completed and reviewed: airway patency, cardiovascular function, mental status, nausea/vomiting, pain level and respiratory function   A pre-sedation assessment was completed prior to the start of the procedure Immediate pre-procedure details:    Reassessment: Patient reassessed immediately prior to procedure     Reviewed: vital signs, relevant labs/tests and NPO status     Verified: bag valve mask available, emergency equipment available, intubation equipment available, IV patency confirmed, oxygen available and suction available   Procedure  details (see MAR for exact dosages):    Preoxygenation:  Nasal cannula   Sedation:  Propofol   Intended level of sedation: deep   Analgesia:  Fentanyl   Intra-procedure monitoring:  Blood pressure monitoring, cardiac monitor, continuous pulse oximetry, frequent LOC assessments, frequent vital sign checks and continuous capnometry   Intra-procedure events: none     Total Provider sedation time (minutes):  15 Post-procedure details:   A post-sedation assessment was completed following the completion of the procedure.   Attendance: Constant attendance by certified staff until patient recovered     Recovery: Patient returned to pre-procedure baseline     Post-sedation assessments completed and reviewed: airway patency, cardiovascular function, mental status, nausea/vomiting, pain level and respiratory function     Patient is stable for discharge or admission: yes     Procedure completion:  Tolerated well, no immediate complications     Medications Ordered in ED Medications  fentaNYL (SUBLIMAZE) injection 50 mcg (has no administration in time range)  propofol (DIPRIVAN) 10 mg/mL bolus/IV push 60 mg (has no administration in time range)  fentaNYL (SUBLIMAZE) 50 MCG/ML injection (has no administration in time range)  morphine (PF) 4 MG/ML injection 6 mg (6 mg Subcutaneous Given 12/28/23 1215)  fentaNYL (SUBLIMAZE) injection (50 mcg Intravenous Given 12/28/23 1554)  propofol (DIPRIVAN) 10 mg/mL bolus/IV push (60 mg Intravenous Given 12/28/23 1556)  propofol (DIPRIVAN) 10 mg/mL bolus/IV push (30 mg Intravenous Given 12/28/23 1559)  fentaNYL (SUBLIMAZE) injection 50 mcg (50 mcg Intravenous Given 12/28/23 1603)    ED Course/ Medical Decision Making/ A&P                                 Medical Decision Making Adult female presents with ankle deformity following minor fall that occurred earlier today.  Patient's x-ray reviewed prior to my initial evaluation as was ordered from triage.  Findings  concerning for fracture dislocation.  Following this I discussed risks and benefits of conscious sedation with reduction with the patient, and her father.  Patient amenable to this.  No other injuries, no other complaints.  Amount and/or Complexity of Data Reviewed Radiology: ordered and independent interpretation performed. Decision-making details documented in ED Course. Discussion of management or test interpretation with external provider(s): I discussed patient's case with her Ortho tech prior to reduction and subsequently after we reviewed the x-rays.  Risk Prescription  drug management.  5:03 PM I discussed patient's case with Dr. Everardo Pacific our orthopedic colleague.  Patient will go to the OR tonight for definitive repair.  Patient aware of this, she remains hemodynamically unremarkable.   Final Clinical Impression(s) / ED Diagnoses Final diagnoses:  Closed fracture of right ankle, initial encounter    Rx / DC Orders ED Discharge Orders     None         Gerhard Munch, MD 12/28/23 1733

## 2023-12-28 NOTE — Transfer of Care (Signed)
 Immediate Anesthesia Transfer of Care Note  Patient: Darlene Ruiz  Procedure(s) Performed: OPEN REDUCTION INTERNAL FIXATION (ORIF) ANKLE FRACTURE (Right: Ankle)  Patient Location: PACU  Anesthesia Type:General  Level of Consciousness: drowsy  Airway & Oxygen Therapy: Patient Spontanous Breathing  Post-op Assessment: Report given to RN and Post -op Vital signs reviewed and stable  Post vital signs: Reviewed and stable  Last Vitals:  Vitals Value Taken Time  BP 134./86   Temp    Pulse 97   Resp 14   SpO2 99%     Last Pain:  Vitals:   12/28/23 1720  TempSrc:   PainSc: 9          Complications: No notable events documented.

## 2023-12-28 NOTE — Anesthesia Preprocedure Evaluation (Addendum)
 Anesthesia Evaluation  Patient identified by MRN, date of birth, ID band Patient awake    Reviewed: Allergy & Precautions, NPO status , Patient's Chart, lab work & pertinent test results  Airway Mallampati: II  TM Distance: >3 FB Neck ROM: Full    Dental  (+) Teeth Intact, Dental Advisory Given   Pulmonary neg pulmonary ROS   Pulmonary exam normal breath sounds clear to auscultation       Cardiovascular hypertension, Pt. on medications Normal cardiovascular exam Rhythm:Regular Rate:Normal     Neuro/Psych negative neurological ROS     GI/Hepatic negative GI ROS, Neg liver ROS,,,  Endo/Other  Obesity Lupus  Renal/GU negative Renal ROS     Musculoskeletal right trimalleolar fracture   Abdominal  (+) + obese  Peds  Hematology negative hematology ROS (+)   Anesthesia Other Findings right trimalleolar fracture  Reproductive/Obstetrics Hcg negative                             Anesthesia Physical Anesthesia Plan  ASA: 2  Anesthesia Plan: General   Post-op Pain Management: Tylenol PO (pre-op)* and Toradol IV (intra-op)*   Induction: Intravenous  PONV Risk Score and Plan: 3 and Midazolam, Dexamethasone and Ondansetron  Airway Management Planned: Oral ETT  Additional Equipment:   Intra-op Plan:   Post-operative Plan: Extubation in OR  Informed Consent: I have reviewed the patients History and Physical, chart, labs and discussed the procedure including the risks, benefits and alternatives for the proposed anesthesia with the patient or authorized representative who has indicated his/her understanding and acceptance.     Dental advisory given  Plan Discussed with: CRNA  Anesthesia Plan Comments:        Anesthesia Quick Evaluation

## 2023-12-28 NOTE — ED Notes (Signed)
 S

## 2023-12-28 NOTE — ED Notes (Signed)
 Jess Barters RN and Margarate

## 2023-12-28 NOTE — Interval H&P Note (Signed)
 All questions answered, patient wants to proceed with procedure. ? ?

## 2023-12-28 NOTE — ED Notes (Signed)
 Jess Barters RN and Marylene Land RN wasted 2 mg Morphine in the sterile container.

## 2023-12-28 NOTE — Consult Note (Signed)
 ORTHOPAEDIC CONSULTATION  REQUESTING PHYSICIAN: No att. providers found  Chief Complaint: Right mall reduced ankle fracture  HPI: Darlene Ruiz is a 28 y.o. female with right ankle fracture that despite ER reduction was still mall reduced.  She is otherwise healthy.  This happened earlier today with a ground-level fall.  She does have a history of lupus.  Of note of taking care of her stepfather Cindie Laroche.  Past Medical History:  Diagnosis Date   Hypertension    Lupus    Renal disorder    Past Surgical History:  Procedure Laterality Date   RENAL BIOPSY     Social History   Socioeconomic History   Marital status: Single    Spouse name: Not on file   Number of children: Not on file   Years of education: Not on file   Highest education level: Not on file  Occupational History   Not on file  Tobacco Use   Smoking status: Never   Smokeless tobacco: Never  Vaping Use   Vaping status: Every Day  Substance and Sexual Activity   Alcohol use: Yes    Comment: social   Drug use: No   Sexual activity: Not on file  Other Topics Concern   Not on file  Social History Narrative   Not on file   Social Drivers of Health   Financial Resource Strain: Not on file  Food Insecurity: Not on file  Transportation Needs: Not on file  Physical Activity: Not on file  Stress: Not on file  Social Connections: Unknown (07/25/2022)   [facility]   Social Network    Social Network: Not on file   Family History  Problem Relation Age of Onset   Hypertension Other    Allergies  Allergen Reactions   Tamiflu [Oseltamivir] Shortness Of Breath   Amoxicillin Nausea And Vomiting and Rash   Prior to Admission medications   Medication Sig Start Date End Date Taking? Authorizing Provider  azaTHIOprine (IMURAN) 50 MG tablet Take 100 mg by mouth daily. 11/26/16  Yes [provider]  hydroxychloroquine (PLAQUENIL) 200 MG tablet Take 300 mg by mouth  daily.   Yes [provider]  carisoprodol (SOMA) 350 MG tablet Take 1 tablet (350 mg total) by mouth 3 (three) times daily as needed for muscle spasms. 01/07/22   Redwine, Madison A, PA-C  desonide (DESOWEN) 0.05 % ointment Apply 1 application topically daily.    [provider]  fluocinonide ointment (LIDEX) 0.05 % Apply 1 application topically See admin instructions. Apply 2 times a day to affected areas of lupus. Strict sun protection.  Stop when smooth. 03/25/19   [provider]  lisinopril (PRINIVIL,ZESTRIL) 10 MG tablet Take 10 mg by mouth daily. Patient not taking: Reported on 12/28/2023    [provider]  medroxyPROGESTERone (DEPO-PROVERA) 150 MG/ML injection Inject 150 mg into the muscle every 3 (three) months. 11/08/20   [provider]  predniSONE (DELTASONE) 20 MG tablet Take 2 tablets (40 mg total) by mouth daily. 11/29/22   Achille Rich, PA-C  valACYclovir (VALTREX) 1000 MG tablet Take 1,000 mg by mouth daily as needed (OB).    [provider]   DG MINI C-ARM IMAGE ONLY Result Date: 12/28/2023 There is no interpretation for this exam.  This order is for images obtained during a surgical procedure.  Please See "Surgeries" Tab for more information regarding the procedure.   DG Ankle Complete Right Result Date:  12/28/2023 CLINICAL DATA:  Post reduction films. EXAM: RIGHT ANKLE - COMPLETE 3+ VIEW COMPARISON:  December 28, 2023 (12:24 p.m.) FINDINGS: The right ankle was imaged in a plaster cast with subsequently obscured osseous and soft tissue detail. Acute fractures of the right lateral malleolus and right medial malleolus are noted with gross anatomic alignment. Mild disruption of the right ankle mortise is noted. Mild diffuse soft tissue swelling is suspected. IMPRESSION: Status post reduction of acute fractures of the right lateral malleolus and right medial malleolus. Electronically Signed   By: Aram Candela M.D.   On: 12/28/2023  17:46   DG Ankle Complete Right Result Date: 12/28/2023 CLINICAL DATA:  Status post fall with ankle fracture EXAM: RIGHT ANKLE - COMPLETE 3 VIEW; RIGHT FOOT COMPLETE - 3 VIEW COMPARISON:  None Available. FINDINGS: Comminuted fracture of the distal fibular diaphysis with 1.5 cm foreshortening and 1/2 shaft width lateral displacement. Fractures of the medial malleolus with 1 cm lateral displacement. There is disruption of the ankle mortise and the talus is laterally displaced relative to the distal tibia and fibula. Moderate joint effusion. Diffuse soft tissue swelling about the ankle. IMPRESSION: Comminuted fracture of the distal fibular diaphysis and displaced fracture of the medial malleolus with disruption of the ankle mortise. Electronically Signed   By: Agustin Cree M.D.   On: 12/28/2023 14:27   DG Foot Complete Right Result Date: 12/28/2023 CLINICAL DATA:  Status post fall with ankle fracture EXAM: RIGHT ANKLE - COMPLETE 3 VIEW; RIGHT FOOT COMPLETE - 3 VIEW COMPARISON:  None Available. FINDINGS: Comminuted fracture of the distal fibular diaphysis with 1.5 cm foreshortening and 1/2 shaft width lateral displacement. Fractures of the medial malleolus with 1 cm lateral displacement. There is disruption of the ankle mortise and the talus is laterally displaced relative to the distal tibia and fibula. Moderate joint effusion. Diffuse soft tissue swelling about the ankle. IMPRESSION: Comminuted fracture of the distal fibular diaphysis and displaced fracture of the medial malleolus with disruption of the ankle mortise. Electronically Signed   By: Agustin Cree M.D.   On: 12/28/2023 14:27   Family History Reviewed and non-contributory, no pertinent history of problems with bleeding or anesthesia      Review of Systems 14 system ROS conducted and negative except for that noted in HPI   OBJECTIVE  Vitals:Patient Vitals for the past 8 hrs:  BP Temp Temp src Pulse Resp SpO2 Height Weight  12/28/23 1700 (!)  137/91 -- -- -- 19 99 % -- --  12/28/23 1645 (!) 132/94 -- -- 97 (!) 25 100 % -- --  12/28/23 1630 129/71 -- -- (!) 105 19 100 % -- --  12/28/23 1615 110/83 -- -- (!) 104 15 100 % -- --  12/28/23 1610 119/67 -- -- 100 (!) 24 100 % -- --  12/28/23 1606 117/84 -- -- (!) 114 13 100 % -- --  12/28/23 1559 112/65 -- -- (!) 136 20 100 % -- --  12/28/23 1552 (!) 140/106 -- -- (!) 113 (!) 21 100 % -- --  12/28/23 1537 (!) 129/91 98.8 F (37.1 C) -- (!) 101 18 99 % -- --  12/28/23 1141 -- -- -- -- -- -- 4\' 8"  (1.422 m) 63.5 kg  12/28/23 1139 122/87 98.7 F (37.1 C) Oral (!) 104 (!) 21 100 % -- --   General: Alert, no acute distress Cardiovascular: Warm extremities noted Respiratory: No cyanosis, no use of accessory musculature GI: No organomegaly, abdomen is soft and non-tender  Skin: No lesions in the area of chief complaint other than those listed below in MSK exam.  Neurologic: Sensation intact distally save for the below mentioned MSK exam Psychiatric: Patient is competent for consent with normal mood and affect Lymphatic: No swelling obvious and reported other than the area involved in the exam below Extremities  Right lower extremity: Splint in place, wiggling toes, warm perfused foot, cannot check skin due to splint.    Test Results Imaging X-rays demonstrate a laterally displaced talus in the setting of a trimalleolar ankle fracture residual after reduction.  Labs cbc Recent Labs    12/28/23 1720  WBC 8.5  HGB 13.0  HCT 39.3  PLT 270    Labs inflam No results for input(s): "CRP" in the last 72 hours.  Invalid input(s): "ESR"  Labs coag No results for input(s): "INR", "PTT" in the last 72 hours.  Invalid input(s): "PT"  Recent Labs    12/28/23 1720  NA 143  K 4.1  CL 109  CO2 21*  GLUCOSE 83  BUN 13  CREATININE 0.81  CALCIUM 8.5*     ASSESSMENT AND PLAN: 28 y.o. female with the following: Trimalleolar ankle fracture with malreduction.  Unfortunately due  to the displaced unstable nature of this fracture of the surgical.  Based on her malreduction I felt that it was appropriate in this young healthy patient to try and obtain appropriate anatomic fixation tonight to avoid further skin trauma.  Risks and  benefits alternatives were discussed with the patient and her mother on the phone.  All questions were answered.  Specific risks of infection, hardware failure and need for external fixation were discussed.  Patient will be discharged postoperatively.

## 2023-12-28 NOTE — Anesthesia Postprocedure Evaluation (Signed)
 Anesthesia Post Note  Patient: Darlene Ruiz  Procedure(s) Performed: OPEN REDUCTION INTERNAL FIXATION (ORIF) ANKLE FRACTURE (Right: Ankle)     Patient location during evaluation: PACU Anesthesia Type: General Level of consciousness: awake and alert Pain management: pain level controlled Vital Signs Assessment: post-procedure vital signs reviewed and stable Respiratory status: spontaneous breathing, nonlabored ventilation, respiratory function stable and patient connected to nasal cannula oxygen Cardiovascular status: blood pressure returned to baseline and stable Postop Assessment: no apparent nausea or vomiting Anesthetic complications: no   No notable events documented.  Last Vitals:  Vitals:   12/28/23 2015 12/28/23 2030  BP: 134/86 (!) 143/98  Pulse: 95 84  Resp: 20 (!) 25  Temp:    SpO2: 100% 100%    Last Pain:  Vitals:   12/28/23 2030  TempSrc:   PainSc: Asleep                 Collene Schlichter

## 2023-12-28 NOTE — Consult Note (Signed)
 Reason for Consult:Right ankle fx Referring Physician: Adalberto Cole Time called: 1517 Time at bedside: 1520   Darlene Ruiz is an 29 y.o. female.  HPI: Ziya fell down her stairs early this morning. She had immediate pain and couldn't get up but crawled onto her couch and went back to sleep. When she woke up she still could not bear weight and so was brought to the ED. X-rays showed a right ankle fx and orthopedic surgery was consulted. She works as a Production designer, theatre/television/film at [employer].  Past Medical History:  Diagnosis Date   Hypertension    Lupus    Renal disorder     Past Surgical History:  Procedure Laterality Date   RENAL BIOPSY      Family History  Problem Relation Age of Onset   Hypertension Other     Social History:  reports that she has never smoked. She has never used smokeless tobacco. She reports current alcohol use. She reports that she does not use drugs.  Allergies:  Allergies  Allergen Reactions   Tamiflu [Oseltamivir] Shortness Of Breath   Amoxicillin Other (See Comments)    Stomach upset    Medications: I have reviewed the patient's current medications.  No results found for this or any previous visit (from the past 48 hours).  DG Ankle Complete Right Result Date: 12/28/2023 CLINICAL DATA:  Status post fall with ankle fracture EXAM: RIGHT ANKLE - COMPLETE 3 VIEW; RIGHT FOOT COMPLETE - 3 VIEW COMPARISON:  None Available. FINDINGS: Comminuted fracture of the distal fibular diaphysis with 1.5 cm foreshortening and 1/2 shaft width lateral displacement. Fractures of the medial malleolus with 1 cm lateral displacement. There is disruption of the ankle mortise and the talus is laterally displaced relative to the distal tibia and fibula. Moderate joint effusion. Diffuse soft tissue swelling about the ankle. IMPRESSION: Comminuted fracture of the distal fibular diaphysis and displaced fracture of the medial malleolus with disruption of the ankle mortise. Electronically Signed    By: Agustin Cree M.D.   On: 12/28/2023 14:27   DG Foot Complete Right Result Date: 12/28/2023 CLINICAL DATA:  Status post fall with ankle fracture EXAM: RIGHT ANKLE - COMPLETE 3 VIEW; RIGHT FOOT COMPLETE - 3 VIEW COMPARISON:  None Available. FINDINGS: Comminuted fracture of the distal fibular diaphysis with 1.5 cm foreshortening and 1/2 shaft width lateral displacement. Fractures of the medial malleolus with 1 cm lateral displacement. There is disruption of the ankle mortise and the talus is laterally displaced relative to the distal tibia and fibula. Moderate joint effusion. Diffuse soft tissue swelling about the ankle. IMPRESSION: Comminuted fracture of the distal fibular diaphysis and displaced fracture of the medial malleolus with disruption of the ankle mortise. Electronically Signed   By: Agustin Cree M.D.   On: 12/28/2023 14:27    Review of Systems  HENT:  Negative for ear discharge, ear pain, hearing loss and tinnitus.   Eyes:  Negative for photophobia and pain.  Respiratory:  Negative for cough and shortness of breath.   Cardiovascular:  Negative for chest pain.  Gastrointestinal:  Negative for abdominal pain, nausea and vomiting.  Genitourinary:  Negative for dysuria, flank pain, frequency and urgency.  Musculoskeletal:  Positive for arthralgias (Right ankle). Negative for back pain, myalgias and neck pain.  Neurological:  Negative for dizziness and headaches.  Hematological:  Does not bruise/bleed easily.  Psychiatric/Behavioral:  The patient is not nervous/anxious.    Blood pressure 122/87, pulse (!) 104, temperature 98.7 F (37.1 C), temperature  source Oral, resp. rate (!) 21, height 4\' 8"  (1.422 m), weight 63.5 kg, SpO2 100%. Physical Exam Constitutional:      General: She is not in acute distress.    Appearance: She is well-developed. She is not diaphoretic.  HENT:     Head: Normocephalic and atraumatic.  Eyes:     General: No scleral icterus.       Right eye: No discharge.         Left eye: No discharge.     Conjunctiva/sclera: Conjunctivae normal.  Cardiovascular:     Rate and Rhythm: Normal rate and regular rhythm.  Pulmonary:     Effort: Pulmonary effort is normal. No respiratory distress.  Musculoskeletal:     Cervical back: Normal range of motion.     Comments: LLE No traumatic wounds, ecchymosis, or rash  Severe TTP ankle  No knee effusion  Knee stable to varus/ valgus and anterior/posterior stress  Sens DPN, SPN, TN intact  Motor EHL 5/5  DP 2+, No significant edema  Skin:    General: Skin is warm and dry.  Neurological:     Mental Status: She is alert.  Psychiatric:        Mood and Affect: Mood normal.        Behavior: Behavior normal.     Assessment/Plan: Right ankle fx -- Will need reduction and splint. As long as adequate reduction can d/c home NWB RLE and f/u with Dr. Everardo Pacific this week to discuss surgery.    Freeman Caldron, PA-C Orthopedic Surgery [phone] 12/28/2023, 3:28 PM

## 2024-01-01 ENCOUNTER — Encounter (HOSPITAL_COMMUNITY): Payer: Self-pay | Admitting: Orthopaedic Surgery

## 2024-06-17 DIAGNOSIS — R946 Abnormal results of thyroid function studies: Secondary | ICD-10-CM | POA: Diagnosis not present

## 2024-06-17 DIAGNOSIS — E559 Vitamin D deficiency, unspecified: Secondary | ICD-10-CM | POA: Diagnosis not present

## 2024-06-17 DIAGNOSIS — Z13220 Encounter for screening for lipoid disorders: Secondary | ICD-10-CM | POA: Diagnosis not present

## 2024-06-17 DIAGNOSIS — Z5181 Encounter for therapeutic drug level monitoring: Secondary | ICD-10-CM | POA: Diagnosis not present

## 2024-06-17 DIAGNOSIS — R7309 Other abnormal glucose: Secondary | ICD-10-CM | POA: Diagnosis not present

## 2024-06-21 ENCOUNTER — Emergency Department (HOSPITAL_BASED_OUTPATIENT_CLINIC_OR_DEPARTMENT_OTHER): Admitting: Radiology

## 2024-06-21 ENCOUNTER — Encounter (HOSPITAL_BASED_OUTPATIENT_CLINIC_OR_DEPARTMENT_OTHER): Payer: Self-pay | Admitting: Emergency Medicine

## 2024-06-21 ENCOUNTER — Emergency Department (HOSPITAL_BASED_OUTPATIENT_CLINIC_OR_DEPARTMENT_OTHER)
Admission: EM | Admit: 2024-06-21 | Discharge: 2024-06-21 | Disposition: A | Discharge status: Home / Self Care | Attending: Emergency Medicine | Admitting: Emergency Medicine

## 2024-06-21 ENCOUNTER — Other Ambulatory Visit: Payer: Self-pay

## 2024-06-21 DIAGNOSIS — R059 Cough, unspecified: Secondary | ICD-10-CM | POA: Diagnosis not present

## 2024-06-21 DIAGNOSIS — U071 COVID-19: Secondary | ICD-10-CM | POA: Diagnosis not present

## 2024-06-21 DIAGNOSIS — R0602 Shortness of breath: Secondary | ICD-10-CM | POA: Diagnosis not present

## 2024-06-21 LAB — RESP PANEL BY RT-PCR (RSV, FLU A&B, COVID)  RVPGX2
Influenza A by PCR: NEGATIVE
Influenza B by PCR: NEGATIVE
Resp Syncytial Virus by PCR: NEGATIVE
SARS Coronavirus 2 by RT PCR: POSITIVE — AB

## 2024-06-21 NOTE — Discharge Instructions (Signed)
 Drink plenty of fluids and get plenty of rest.  Take Tylenol  1000 mg every 8 hours as needed for fever.  Take over-the-counter medications as needed for relief of symptoms.  Return to the ER if your symptoms significantly worsen or change.

## 2024-06-21 NOTE — ED Triage Notes (Signed)
  Patient comes in with generalized body aches and nasal congestion that has been going on for 2 days.  Patient endorses productive cough, nasal congestion, and throat irritation.  Has been taking alka seltzer cold/flu at home with last dose around 1900.  Afebrile.  Hx lupus.  Pain 8/10, aching.

## 2024-06-21 NOTE — ED Provider Notes (Signed)
 [place] EMERGENCY DEPARTMENT AT [facility] Provider Note   CSN: [id number] Arrival date & time: 06/21/24  9685     Patient presents with: Cough and Nasal Congestion   Lasya SAMINA WEEKES is a 28 y.o. female.   Patient is a 28 year old female presenting with complaints of cough and bodyaches for the past several days.  She has felt feverish, but has not taken her temperature.  She has been taking over-the-counter medications with little relief.       Prior to Admission medications   Medication Sig Start Date End Date Taking? Authorizing Provider  azaTHIOprine (IMURAN) 50 MG tablet Take 100 mg by mouth daily. 11/26/16   [provider]  carisoprodol  (SOMA ) 350 MG tablet Take 1 tablet (350 mg total) by mouth 3 (three) times daily as needed for muscle spasms. 01/07/22   Redwine, Madison A, PA-C  desonide (DESOWEN) 0.05 % ointment Apply 1 application topically daily.    [provider]  fluocinonide ointment (LIDEX) 0.05 % Apply 1 application topically See admin instructions. Apply 2 times a day to affected areas of lupus. Strict sun protection.  Stop when smooth. 03/25/19   [provider]  hydroxychloroquine  (PLAQUENIL ) 200 MG tablet Take 300 mg by mouth daily.    [provider]  lisinopril  (PRINIVIL ,ZESTRIL ) 10 MG tablet Take 10 mg by mouth daily. Patient not taking: Reported on 12/28/2023    [provider]  medroxyPROGESTERone  (DEPO-PROVERA ) 150 MG/ML injection Inject 150 mg into the muscle every 3 (three) months. 11/08/20   [provider]  predniSONE  (DELTASONE ) 20 MG tablet Take 2 tablets (40 mg total) by mouth daily. 11/29/22   Bernis Ernst, PA-C  valACYclovir (VALTREX) 1000 MG tablet Take 1,000 mg by mouth daily as needed (OB).    [provider]    Allergies: Tamiflu [oseltamivir] and Amoxicillin     Review of Systems  All other systems reviewed and are negative.   Updated Vital Signs BP (!) 154/106 (BP  Location: Right Arm)   Pulse (!) 111   Temp 98.5 F (36.9 C) (Oral)   Resp (!) 22   Ht 4' 8 (1.422 m)   Wt 62.1 kg   SpO2 99%   BMI 30.71 kg/m   Physical Exam Vitals and nursing note reviewed.  Constitutional:      General: She is not in acute distress.    Appearance: Normal appearance. She is well-developed. She is not diaphoretic.  HENT:     Head: Normocephalic and atraumatic.  Cardiovascular:     Rate and Rhythm: Normal rate and regular rhythm.     Heart sounds: No murmur heard.    No friction rub. No gallop.  Pulmonary:     Effort: Pulmonary effort is normal. No respiratory distress.     Breath sounds: Normal breath sounds. No wheezing.  Abdominal:     General: Bowel sounds are normal. There is no distension.     Palpations: Abdomen is soft.     Tenderness: There is no abdominal tenderness.  Musculoskeletal:        General: Normal range of motion.     Cervical back: Normal range of motion and neck supple.  Skin:    General: Skin is warm and dry.  Neurological:     General: No focal deficit present.     Mental Status: She is alert and oriented to person, place, and time.     (all labs ordered are listed, but only abnormal results are displayed) Labs Reviewed  RESP PANEL BY RT-PCR (RSV, FLU A&B, COVID)  [id number] - Abnormal; Notable for the following components:      Result Value   SARS Coronavirus 2 by RT PCR POSITIVE (*)    All other components within normal limits    EKG: None  Radiology: DG Chest 2 View Result Date: 06/21/2024 CLINICAL DATA:  Cough, shortness of breath EXAM: CHEST - 2 VIEW COMPARISON:  11/29/2022 FINDINGS: The heart size and mediastinal contours are within normal limits. Both lungs are clear. The visualized skeletal structures are unremarkable. IMPRESSION: No active cardiopulmonary disease. Electronically Signed   By: Franky Crease M.D.   On: 06/21/2024 03:46     Procedures   Medications Ordered in the ED - No data to display                                   Medical Decision Making Amount and/or Complexity of Data Reviewed Radiology: ordered.   Patient presenting with bodyaches and URI symptoms as described in the HPI.  COVID test is positive.  Patient will be treated with continued use of over-the-counter medications and as needed return.     Final diagnoses:  None    ED Discharge Orders     None          Geroldine Berg, MD 06/21/24 [phone]

## 2024-06-28 DIAGNOSIS — M3214 Glomerular disease in systemic lupus erythematosus: Secondary | ICD-10-CM | POA: Diagnosis not present

## 2024-07-12 DIAGNOSIS — M329 Systemic lupus erythematosus, unspecified: Secondary | ICD-10-CM | POA: Diagnosis not present

## 2024-07-12 DIAGNOSIS — M3214 Glomerular disease in systemic lupus erythematosus: Secondary | ICD-10-CM | POA: Diagnosis not present

## 2024-07-12 DIAGNOSIS — H109 Unspecified conjunctivitis: Secondary | ICD-10-CM | POA: Diagnosis not present

## 2024-07-12 DIAGNOSIS — N3941 Urge incontinence: Secondary | ICD-10-CM | POA: Diagnosis not present

## 2024-07-12 DIAGNOSIS — Z0000 Encounter for general adult medical examination without abnormal findings: Secondary | ICD-10-CM | POA: Diagnosis not present

## 2024-07-12 DIAGNOSIS — R109 Unspecified abdominal pain: Secondary | ICD-10-CM | POA: Diagnosis not present

## 2024-07-12 DIAGNOSIS — E559 Vitamin D deficiency, unspecified: Secondary | ICD-10-CM | POA: Diagnosis not present

## 2024-10-03 ENCOUNTER — Ambulatory Visit: Admitting: Obstetrics
